# Patient Record
Sex: Male | Born: 1960 | Race: White | Hispanic: No | Marital: Married | Smoking: Current every day smoker
Health system: Southern US, Community
[De-identification: ages and names within clinical notes are randomized; demographics above are authoritative.]

## PROBLEM LIST (undated history)

## (undated) DIAGNOSIS — Z8719 Personal history of other diseases of the digestive system: Secondary | ICD-10-CM

## (undated) DIAGNOSIS — K219 Gastro-esophageal reflux disease without esophagitis: Secondary | ICD-10-CM

## (undated) DIAGNOSIS — J449 Chronic obstructive pulmonary disease, unspecified: Secondary | ICD-10-CM

---

## 2007-06-26 ENCOUNTER — Inpatient Hospital Stay (HOSPITAL_COMMUNITY): Admission: EM | Admit: 2007-06-26 | Discharge: 2007-06-28 | Payer: Self-pay | Admitting: Emergency Medicine

## 2007-07-12 ENCOUNTER — Encounter: Admission: RE | Admit: 2007-07-12 | Discharge: 2007-07-12 | Payer: Self-pay | Admitting: Orthopedic Surgery

## 2010-12-17 NOTE — Op Note (Signed)
NAME:  Manuel Anderson, Manuel Anderson               ACCOUNT NO.:  [id number]   MEDICAL RECORD NO.:  [id number]          PATIENT TYPE:  INP   LOCATION:  2550                         FACILITY:  [facility]   PHYSICIAN:  Burnard Bunting, M.D.    DATE OF BIRTH:  November 30, 1960   DATE OF PROCEDURE:  06/27/2007  DATE OF DISCHARGE:                               OPERATIVE REPORT   PREOPERATIVE DIAGNOSIS:  Open right comminuted olecranon and proximal  ulnar fracture.   POSTOPERATIVE DIAGNOSIS:  Open right comminuted olecranon and proximal  ulnar fracture.   PROCEDURES:  1. Right elbow debridement of skin and subcutaneous tissue, muscle      fascia and bone associated with open fracture.  2. Right elbow open reduction and internal fixation of comminuted      multifragmented proximal ulnar fracture.   SURGEON:  Burnard Bunting, M.D.   ASSISTANT:  Genene Churn. Denton Meek.   ANESTHESIA:  General. Endotracheal.   ESTIMATED BLOOD LOSS:  Minimal.   TOURNIQUET TIME:  One hour sixteen minutes at 250 mmHg.   INDICATIONS:  Manuel Anderson is a 50 year old patient with right open  comminuted proximal ulnar fracture who presents now for operative  management after explanation of the risks and benefits.   PROCEDURE IN DETAIL:  The patient is brought to the operating room where  general endotracheal anesthesia was induced.  Perioperative antibiotics  were administered.  Right elbow arm and hand was prepped with DuraPrep  solution and draped in a sterile manner.  A 1-cm laceration was noted  over the open fracture area.  Skin and subcutaneous tissue, muscle,  fascia and bone was then debrided after the incision was extended  proximally and distally.  Care was taken to avoid injury to the ulnar  nerve.  Periosteal dissection was performed in order to visualize the  subcutaneous portion of the ulna.  Multifragmented fracture was  identified.  Irrigation was performed.  The distal fragment and middle  fragment were reduced and held  with the reduction clamp and the lag  screw was placed under direct visualization from ulnar to radial.  In a  similar fashion, the proximal fragment and middle fragment were reduced  and the lag screw was placed.  An anatomic proximal ulnar plate was  fixed to the reduced fracture.  Under fluoroscopic guidance, the correct  length screws were placed.  Stable construct was achieved.  The  tourniquet was released.  Bleeding points encountered were closed with  electrocautery.  The arm was taken through a range of motion, no  grinding or crepitus was noted.  The patient has full flexion and  extension, pronation and supination.   At this time, under fluoroscopic guidance, correct hardware placement  was confirmed.  The incision was thoroughly irrigated and closed using  interrupted, inverted 2-0 Vicryl suture to achieve soft tissue closure  over the plate, followed by skin staples to reapproximate the skin  edges.  Bulky posterior splint was applied.  The patient tolerated the  procedure well without immediate complication.  He was transferred to  the recovery room in stable condition.  It should be noted that the assistant, Zonia Kief, was required at all  times during this case for the difficult exposure, fracture positioning,  implant placement and retraction of important neurovascular structures.  His assistance was a medical necessity for this case.      Burnard Bunting, M.D.  Electronically Signed     GSD/MEDQ  D:  06/27/2007  T:  06/27/2007  Job:  [id number]

## 2010-12-17 NOTE — H&P (Signed)
NAME:  Manuel, Anderson NO.:  [id number]   MEDICAL RECORD NO.:  [id number]          PATIENT TYPE:  EMS   LOCATION:  MAJO                         FACILITY:  [facility]   PHYSICIAN:  Burnard Bunting, M.D.    DATE OF BIRTH:  1961/05/28   DATE OF ADMISSION:  06/26/2007  DATE OF DISCHARGE:                              HISTORY & PHYSICAL   Silver Trauma Admission   CHIEF COMPLAINT:  Right elbow pain.   HISTORY OF PRESENT ILLNESS:  Manuel Anderson is a 50 year old, right-hand  dominant patient who had sustained an injury to his right elbow today  when he was cutting down a tree.  The tree limb fell in the wrong  direction which subsequently caused another tree to fall and break apart  and come up and hit him on the elbow.  He reports subsequent pain.  There was no loss of consciousness.  He was hypotensive when he arrived  in the ER, but responded nicely to fluids.  He denies any loss of  consciousness or numbness and tingling in his arm.   PAST MEDICAL HISTORY:  Negative.   PAST SURGICAL HISTORY:  Negative.   CURRENT MEDICATIONS:  Aleve and Vicodin.   HE IS ALLERGIC TO IBUPROFEN.   He is right-hand dominant, married.  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   PHYSICAL EXAMINATION:  GENERAL:  He is in mild distress.  VITAL SIGNS:  His blood pressure is 121/59, pulse 74, respirations 16.  He has a 99% room air sat.  CHEST:  Clear to auscultation.  HEART:  Regular rate and rhythm.  ABDOMINAL EXAM:  Benign.  EXTREMITIES:  He has a right arm puncture wound over the olecranon  region with bleeding; it is about a 1 cm laceration.  EPL/FPL inner  osseous is intact 5+/5, radial pulse 2+/4.  He denies any wrist or  shoulder pain.   Chest x-ray is within normal limits.   EKG is within normal limits.   Labs show creatinine of 1.1, hemoglobin of 16, sodium and potassium 133  and 3.2, BUN and creatinine 18 and 1.1.   CT of  abdomen is within normal limits.   CT of pelvis is within normal limits.   CT of chest is within normal limits.   Radiographs of the right arm show a three-part olecranon fracture with  the radial head located.   IMPRESSION:  Open right olecranon fracture.   PLAN:  Incision and drainage with plating.  Wrists and benefits  discussed with the patient including, but not limited to infection,  nerve vessel damage, nonunion, need to decrease smoking also discussed  with the patient; 2 gm of Ancef given in the ER, tetanus also given in  the ER.  All questions answered.      Burnard Bunting, M.D.  Electronically Signed     GSD/MEDQ  D:  06/26/2007  T:  06/27/2007  Job:  [id number]

## 2010-12-20 NOTE — Discharge Summary (Signed)
NAME:  Manuel, Anderson               ACCOUNT NO.:  [id number]   MEDICAL RECORD NO.:  [id number]          PATIENT TYPE:  INP   LOCATION:  5033                         FACILITY:  [facility]   PHYSICIAN:  Burnard Bunting, M.D.    DATE OF BIRTH:  10-10-1960   DATE OF ADMISSION:  06/26/2007  DATE OF DISCHARGE:  06/28/2007                               DISCHARGE SUMMARY   DISCHARGE DIAGNOSES:  Open right olecranon/proximal humerus fracture.   SECONDARY DIAGNOSIS:  __________   PROCEDURE:  Open reduction and internal fixation with debridement of  right open olecranon fracture.   HOSPITAL COURSE:  Manuel Anderson is a 50 year old male who sustained open  trauma to his right elbow, resulting in fracture.  He underwent  debridement and open reduction and internal fixation of his elbow  fracture on 06/26/2007.  He was maintained on perioperative IV  antibiotics during his hospital course.  Postoperatively motor and  sensory function of the hand was intact.  He had an unremarkable  recovery.  He was discharged home in good condition.   DISCHARGE MEDICATIONS:  Percocet for pain.  Robaxin as muscle relaxant.   FOLLOWUP:  He will be followed up in 7 days for suture removal.  Will  start range of motion at that time.      Burnard Bunting, M.D.  Electronically Signed     GSD/MEDQ  D:  08/12/2007  T:  08/12/2007  Job:  [id number]

## 2011-05-13 LAB — POCT I-STAT CREATININE
Creatinine, Ser: 1.1
Operator id: 294341

## 2011-05-13 LAB — I-STAT 8, (EC8 V) (CONVERTED LAB)
BUN: 8
Bicarbonate: 21.2
Chloride: 100
HCT: 47
Operator id: 294341
Potassium: 3.2 — ABNORMAL LOW
Sodium: 133 — ABNORMAL LOW
TCO2: 22
pCO2, Ven: 41.4 — ABNORMAL LOW
pH, Ven: 7.318 — ABNORMAL HIGH

## 2011-05-13 LAB — POCT CARDIAC MARKERS: Troponin i, poc: <0.05

## 2011-12-30 ENCOUNTER — Other Ambulatory Visit: Payer: Self-pay | Admitting: Family Medicine

## 2011-12-30 ENCOUNTER — Ambulatory Visit
Admission: RE | Admit: 2011-12-30 | Discharge: 2011-12-30 | Disposition: A | Discharge status: Home / Self Care | Payer: 59 | Source: Ambulatory Visit | Attending: Family Medicine | Admitting: Family Medicine

## 2011-12-30 DIAGNOSIS — R609 Edema, unspecified: Secondary | ICD-10-CM

## 2011-12-30 DIAGNOSIS — R52 Pain, unspecified: Secondary | ICD-10-CM

## 2015-04-27 ENCOUNTER — Inpatient Hospital Stay (HOSPITAL_COMMUNITY)
Admission: EM | Admit: 2015-04-27 | Discharge: 2015-05-01 | DRG: 419 | Disposition: A | Discharge status: Home / Self Care | Payer: Managed Care, Other (non HMO) | Attending: Surgery | Admitting: Surgery

## 2015-04-27 ENCOUNTER — Encounter (HOSPITAL_COMMUNITY): Payer: Self-pay | Admitting: Emergency Medicine

## 2015-04-27 ENCOUNTER — Emergency Department (HOSPITAL_COMMUNITY): Payer: Managed Care, Other (non HMO)

## 2015-04-27 DIAGNOSIS — K810 Acute cholecystitis: Secondary | ICD-10-CM | POA: Diagnosis present

## 2015-04-27 DIAGNOSIS — K222 Esophageal obstruction: Secondary | ICD-10-CM | POA: Diagnosis present

## 2015-04-27 DIAGNOSIS — G8929 Other chronic pain: Secondary | ICD-10-CM | POA: Diagnosis present

## 2015-04-27 DIAGNOSIS — F17210 Nicotine dependence, cigarettes, uncomplicated: Secondary | ICD-10-CM | POA: Diagnosis present

## 2015-04-27 DIAGNOSIS — K8067 Calculus of gallbladder and bile duct with acute and chronic cholecystitis with obstruction: Principal | ICD-10-CM | POA: Diagnosis present

## 2015-04-27 DIAGNOSIS — K8000 Calculus of gallbladder with acute cholecystitis without obstruction: Secondary | ICD-10-CM | POA: Diagnosis present

## 2015-04-27 DIAGNOSIS — Z79899 Other long term (current) drug therapy: Secondary | ICD-10-CM | POA: Diagnosis not present

## 2015-04-27 DIAGNOSIS — K8050 Calculus of bile duct without cholangitis or cholecystitis without obstruction: Secondary | ICD-10-CM

## 2015-04-27 DIAGNOSIS — Z720 Tobacco use: Secondary | ICD-10-CM | POA: Diagnosis present

## 2015-04-27 DIAGNOSIS — R1011 Right upper quadrant pain: Secondary | ICD-10-CM | POA: Diagnosis present

## 2015-04-27 DIAGNOSIS — K819 Cholecystitis, unspecified: Secondary | ICD-10-CM

## 2015-04-27 DIAGNOSIS — Z79891 Long term (current) use of opiate analgesic: Secondary | ICD-10-CM

## 2015-04-27 HISTORY — DX: Chronic obstructive pulmonary disease, unspecified: J44.9

## 2015-04-27 HISTORY — DX: Personal history of other diseases of the digestive system: Z87.19

## 2015-04-27 HISTORY — DX: Gastro-esophageal reflux disease without esophagitis: K21.9

## 2015-04-27 LAB — URINALYSIS, ROUTINE W REFLEX MICROSCOPIC
GLUCOSE, UA: NEGATIVE mg/dL
HGB URINE DIPSTICK: NEGATIVE
Ketones, ur: NEGATIVE mg/dL
Leukocytes, UA: NEGATIVE
Nitrite: NEGATIVE
PH: 8 (ref 5.0–8.0)
Protein, ur: NEGATIVE mg/dL
SPECIFIC GRAVITY, URINE: 1.014 (ref 1.005–1.030)
UROBILINOGEN UA: 1 mg/dL (ref 0.0–1.0)

## 2015-04-27 LAB — CBC
HEMATOCRIT: 43.7 % (ref 39.0–52.0)
Hemoglobin: 15.7 g/dL (ref 13.0–17.0)
MCH: 33.6 pg (ref 26.0–34.0)
MCHC: 35.9 g/dL (ref 30.0–36.0)
MCV: 93.6 fL (ref 78.0–100.0)
Platelets: 298 10*3/uL (ref 150–400)
RBC: 4.67 MIL/uL (ref 4.22–5.81)
RDW: 14.1 % (ref 11.5–15.5)
WBC: 8.2 10*3/uL (ref 4.0–10.5)

## 2015-04-27 LAB — COMPREHENSIVE METABOLIC PANEL
ALBUMIN: 4.4 g/dL (ref 3.5–5.0)
ALT: 287 U/L — ABNORMAL HIGH (ref 17–63)
AST: 218 U/L — AB (ref 15–41)
Alkaline Phosphatase: 148 U/L — ABNORMAL HIGH (ref 38–126)
Anion gap: 6 (ref 5–15)
BILIRUBIN TOTAL: 1.4 mg/dL — AB (ref 0.3–1.2)
CO2: 27 mmol/L (ref 22–32)
Calcium: 9.2 mg/dL (ref 8.9–10.3)
Chloride: 99 mmol/L — ABNORMAL LOW (ref 101–111)
Creatinine, Ser: 0.8 mg/dL (ref 0.61–1.24)
GFR calc Af Amer: >60 mL/min (ref >60)
GFR calc non Af Amer: >60 mL/min (ref >60)
GLUCOSE: 129 mg/dL — AB (ref 65–99)
POTASSIUM: 3.7 mmol/L (ref 3.5–5.1)
SODIUM: 132 mmol/L — AB (ref 135–145)
TOTAL PROTEIN: 7.4 g/dL (ref 6.5–8.1)

## 2015-04-27 LAB — LIPASE, BLOOD: Lipase: 28 U/L (ref 22–51)

## 2015-04-27 MED ORDER — METOCLOPRAMIDE HCL 5 MG/ML IJ SOLN: 5.0000 mg | Freq: Four times a day (QID) | INTRAMUSCULAR | Status: DC | PRN

## 2015-04-27 MED ORDER — FENTANYL CITRATE (PF) 100 MCG/2ML IJ SOLN
50.0000 ug | Freq: Once | INTRAMUSCULAR | Status: AC
  Administered 2015-04-27: 50 ug via INTRAVENOUS
  Filled 2015-04-27: qty 2

## 2015-04-27 MED ORDER — DIPHENHYDRAMINE HCL 50 MG/ML IJ SOLN: 12.5000 mg | Freq: Four times a day (QID) | INTRAMUSCULAR | Status: DC | PRN

## 2015-04-27 MED ORDER — MAGIC MOUTHWASH
15.0000 mL | Freq: Four times a day (QID) | ORAL | Status: DC | PRN
  Filled 2015-04-27: qty 15

## 2015-04-27 MED ORDER — OXYCODONE HCL 5 MG PO TABS: 5.0000 mg | ORAL_TABLET | ORAL | Status: DC | PRN

## 2015-04-27 MED ORDER — SODIUM CHLORIDE 0.9 % IV SOLN
8.0000 mg | Freq: Four times a day (QID) | INTRAVENOUS | Status: DC | PRN
  Filled 2015-04-27: qty 4

## 2015-04-27 MED ORDER — POLYETHYLENE GLYCOL 3350 17 G PO PACK
17.0000 g | PACK | Freq: Two times a day (BID) | ORAL | Status: DC
  Administered 2015-04-27 – 2015-04-30 (×5): 17 g via ORAL
  Filled 2015-04-27 (×11): qty 1

## 2015-04-27 MED ORDER — ONDANSETRON HCL 4 MG/2ML IJ SOLN: 4.0000 mg | Freq: Four times a day (QID) | INTRAMUSCULAR | Status: DC | PRN

## 2015-04-27 MED ORDER — LACTATED RINGERS IV BOLUS (SEPSIS): 1000.0000 mL | Freq: Three times a day (TID) | INTRAVENOUS | Status: DC | PRN

## 2015-04-27 MED ORDER — ALUM & MAG HYDROXIDE-SIMETH 200-200-20 MG/5ML PO SUSP: 30.0000 mL | Freq: Four times a day (QID) | ORAL | Status: DC | PRN

## 2015-04-27 MED ORDER — CHLORHEXIDINE GLUCONATE 4 % EX LIQD
1.0000 "application " | Freq: Once | CUTANEOUS | Status: AC
  Administered 2015-04-28: 1 via TOPICAL
  Filled 2015-04-27: qty 15

## 2015-04-27 MED ORDER — TAB-A-VITE/IRON PO TABS
1.0000 | ORAL_TABLET | Freq: Every day | ORAL | Status: DC
  Administered 2015-04-28 – 2015-04-29 (×2): 1 via ORAL
  Filled 2015-04-27 (×4): qty 1

## 2015-04-27 MED ORDER — DEXTROSE 5 % IV SOLN: 2.0000 g | INTRAVENOUS | Status: DC

## 2015-04-27 MED ORDER — DEXTROSE 5 % IV SOLN
1.0000 g | INTRAVENOUS | Status: AC
  Administered 2015-04-27: 1 g via INTRAVENOUS
  Filled 2015-04-27: qty 10

## 2015-04-27 MED ORDER — METOPROLOL TARTRATE 25 MG PO TABS
12.5000 mg | ORAL_TABLET | Freq: Two times a day (BID) | ORAL | Status: DC | PRN
  Filled 2015-04-27: qty 0.5

## 2015-04-27 MED ORDER — ENOXAPARIN SODIUM 40 MG/0.4ML ~~LOC~~ SOLN
40.0000 mg | Freq: Every day | SUBCUTANEOUS | Status: DC
  Administered 2015-04-27 – 2015-04-30 (×4): 40 mg via SUBCUTANEOUS
  Filled 2015-04-27 (×5): qty 0.4

## 2015-04-27 MED ORDER — ACETAMINOPHEN 500 MG PO TABS
1000.0000 mg | ORAL_TABLET | Freq: Three times a day (TID) | ORAL | Status: DC
  Administered 2015-04-27: 1000 mg via ORAL
  Filled 2015-04-27 (×7): qty 2

## 2015-04-27 MED ORDER — ONDANSETRON 4 MG PO TBDP: 4.0000 mg | ORAL_TABLET | Freq: Four times a day (QID) | ORAL | Status: DC | PRN

## 2015-04-27 MED ORDER — METOPROLOL TARTRATE 1 MG/ML IV SOLN
5.0000 mg | Freq: Four times a day (QID) | INTRAVENOUS | Status: DC | PRN
  Filled 2015-04-27: qty 5

## 2015-04-27 MED ORDER — TRIAMCINOLONE ACETONIDE 0.5 % EX CREA
1.0000 "application " | TOPICAL_CREAM | Freq: Four times a day (QID) | CUTANEOUS | Status: DC | PRN
  Filled 2015-04-27: qty 15

## 2015-04-27 MED ORDER — NICOTINE 14 MG/24HR TD PT24
14.0000 mg | MEDICATED_PATCH | Freq: Every day | TRANSDERMAL | Status: DC
  Administered 2015-04-27 – 2015-04-30 (×4): 14 mg via TRANSDERMAL
  Filled 2015-04-27 (×5): qty 1

## 2015-04-27 MED ORDER — IBUPROFEN 800 MG PO TABS: 800.0000 mg | ORAL_TABLET | Freq: Four times a day (QID) | ORAL | Status: DC | PRN

## 2015-04-27 MED ORDER — HYDROMORPHONE HCL 1 MG/ML IJ SOLN
0.5000 mg | INTRAMUSCULAR | Status: DC | PRN
  Administered 2015-04-28: 1 mg via INTRAVENOUS
  Filled 2015-04-27: qty 1

## 2015-04-27 MED ORDER — BISACODYL 10 MG RE SUPP
10.0000 mg | Freq: Two times a day (BID) | RECTAL | Status: DC | PRN
  Administered 2015-04-29: 10 mg via RECTAL
  Filled 2015-04-27: qty 1

## 2015-04-27 MED ORDER — LACTATED RINGERS IV BOLUS (SEPSIS)
1000.0000 mL | Freq: Once | INTRAVENOUS | Status: AC
  Administered 2015-04-27: 1000 mL via INTRAVENOUS

## 2015-04-27 MED ORDER — PROMETHAZINE HCL 25 MG/ML IJ SOLN
6.2500 mg | INTRAMUSCULAR | Status: DC | PRN
  Administered 2015-04-28: 6.25 mg via INTRAVENOUS

## 2015-04-27 MED ORDER — MENTHOL 3 MG MT LOZG
1.0000 | LOZENGE | OROMUCOSAL | Status: DC | PRN
  Filled 2015-04-27: qty 9

## 2015-04-27 MED ORDER — PANTOPRAZOLE SODIUM 40 MG PO TBEC
40.0000 mg | DELAYED_RELEASE_TABLET | Freq: Every day | ORAL | Status: DC
  Administered 2015-04-28 – 2015-04-30 (×3): 40 mg via ORAL
  Filled 2015-04-27 (×4): qty 1

## 2015-04-27 MED ORDER — SIMETHICONE 80 MG PO CHEW
80.0000 mg | CHEWABLE_TABLET | Freq: Four times a day (QID) | ORAL | Status: DC | PRN
  Administered 2015-04-28: 80 mg via ORAL
  Filled 2015-04-27 (×2): qty 1

## 2015-04-27 MED ORDER — PHENOL 1.4 % MT LIQD
2.0000 | OROMUCOSAL | Status: DC | PRN
Start: 2015-04-27 — End: 2015-05-01
  Filled 2015-04-27: qty 177

## 2015-04-27 MED ORDER — LACTATED RINGERS IV SOLN
INTRAVENOUS | Status: DC
  Administered 2015-04-28 – 2015-04-29 (×3): via INTRAVENOUS

## 2015-04-27 MED ORDER — ONDANSETRON HCL 4 MG/2ML IJ SOLN
4.0000 mg | Freq: Once | INTRAMUSCULAR | Status: AC
  Administered 2015-04-27: 4 mg via INTRAVENOUS
  Filled 2015-04-27: qty 2

## 2015-04-27 MED ORDER — IBUPROFEN 800 MG PO TABS: 800.0000 mg | ORAL_TABLET | Freq: Four times a day (QID) | ORAL | Status: DC

## 2015-04-27 MED ORDER — METRONIDAZOLE IN NACL 5-0.79 MG/ML-% IV SOLN
500.0000 mg | Freq: Four times a day (QID) | INTRAVENOUS | Status: DC
  Administered 2015-04-27 – 2015-04-28 (×3): 500 mg via INTRAVENOUS
  Filled 2015-04-27 (×4): qty 100

## 2015-04-27 MED ORDER — LIP MEDEX EX OINT
1.0000 "application " | TOPICAL_OINTMENT | Freq: Two times a day (BID) | CUTANEOUS | Status: DC
  Administered 2015-04-27 – 2015-04-30 (×7): 1 via TOPICAL
  Filled 2015-04-27: qty 7

## 2015-04-27 MED ORDER — CHLORHEXIDINE GLUCONATE 4 % EX LIQD
1.0000 "application " | Freq: Once | CUTANEOUS | Status: AC
  Administered 2015-04-27: 1 via TOPICAL
  Filled 2015-04-27: qty 15

## 2015-04-27 MED ORDER — ONDANSETRON HCL 4 MG PO TABS: 4.0000 mg | ORAL_TABLET | Freq: Four times a day (QID) | ORAL | Status: DC | PRN

## 2015-04-27 MED ORDER — POLYETHYLENE GLYCOL 3350 17 G PO PACK: 17.0000 g | PACK | Freq: Two times a day (BID) | ORAL | Status: DC | PRN

## 2015-04-27 MED ORDER — LORAZEPAM 2 MG/ML IJ SOLN: 0.5000 mg | Freq: Three times a day (TID) | INTRAMUSCULAR | Status: DC | PRN

## 2015-04-27 MED ORDER — DEXTROSE 5 % IV SOLN
1.0000 g | Freq: Once | INTRAVENOUS | Status: AC
  Administered 2015-04-27: 1 g via INTRAVENOUS
  Filled 2015-04-27: qty 10

## 2015-04-27 MED ORDER — VITAMIN C 500 MG PO TABS
500.0000 mg | ORAL_TABLET | Freq: Two times a day (BID) | ORAL | Status: DC
  Administered 2015-04-27 – 2015-04-30 (×6): 500 mg via ORAL
  Filled 2015-04-27 (×9): qty 1

## 2015-04-27 NOTE — H&P (Signed)
[facility]  [address]  [place], [place] [address] Phone: [phone] FAX: [phone]     Manuel Anderson  08/14/1960 [id number]  CARE TEAM:  PCP: Kaleen Mask, MD  Outpatient Care Team: Patient Care Team: Kaleen Mask, MD as PCP - General (Family Medicine)  Inpatient Treatment Team: Treatment Team: Attending Provider: Benjiman Core, MD; Technician: Berenice Primas, NT; Registered Nurse: Montey Hora, RN; Consulting Physician: Bishop Limbo, MD  This patient is a 54 y.o.male who presents today for surgical evaluation at the request of Benjiman Core, MD, [facility] emergency department.   Reason for evaluation: Thickened gallbladder.  Probable cholecystitis.  Pleasant smoking male.  Comes today with his wife.  Tends to avoid doctors.  Does moderate intense physical activity at work.  Had an episode of upper abdominal pain mainly right-sided about 6 months ago.  Thought it was heartburn or reflux.  Tums and Rolaids seem to help it.  Occasionally takes omeprazole for reflux like symptoms.  However yesterday after having a hotdog for lunch, he began to have intense abdominal pain in the right & central upper abdomen.  Went down but never went away.  Tried just a few bites of eggs & then crackers and sips but continued to worsen through the day.  He came to the emergency room.  Pain very focused in the upper abdomen.  More right-sided.  Feels bloated and nauseated.  Exam and ultrasound concerning for Murphy sign with thickened gallbladder.  Suspicious for cholecystitis.  Surgical consultation requested.  Normal rather active.  Does smoke about a pack and a half of cigarettes a day.  No history of heart attacks or strokes.  No severe dyspnea on exertion or chest pain.  No history of pneumonias.  Not on blood thinners.  No personal nor family history of GI/colon cancer, inflammatory bowel disease, irritable bowel  syndrome, allergy such as Celiac Sprue, dietary/dairy problems, colitis, ulcers nor gastritis.  No recent sick contacts/gastroenteritis.  No travel outside the country.  No changes in diet.  No dysphagia to solids or liquids.  He has never had a colonoscopy.  No hematochezia, hematemesis, coffee ground emesis.  No evidence of prior gastric/peptic ulceration.  History reviewed. No pertinent past medical history.  History reviewed. No pertinent past surgical history.  Social History   Social History  . Marital Status: Married    Spouse Name: N/A  . Number of Children: N/A  . Years of Education: N/A   Occupational History  . Not on file.   Social History Main Topics  . Smoking status: Current Every Day Smoker -- 1.25 packs/day    Types: Cigarettes  . Smokeless tobacco: Not on file  . Alcohol Use: No  . Drug Use: No  . Sexual Activity: Not on file   Other Topics Concern  . Not on file   Social History Narrative  . No narrative on file    History reviewed. No pertinent family history.  No current facility-administered medications for this encounter.   Current Outpatient Prescriptions  Medication Sig Dispense Refill  . HYDROcodone-acetaminophen (NORCO) 7.5-325 MG per tablet Take 1 tablet by mouth every 6 (six) hours.    Marland Kitchen omeprazole (PRILOSEC) 20 MG capsule Take 20 mg by mouth daily.    . Simethicone (GAS-X PO) Take 1 tablet by mouth 2 (two) times daily as needed (gas).    . triamcinolone cream (KENALOG) 0.5 % Apply 1 application topically every  6 (six) hours as needed (rash).       No Known Allergies  ROS: Constitutional:  No fevers, chills, sweats.  Weight stable Eyes:  No vision changes, No discharge HENT:  No sore throats, nasal drainage Lymph: No neck swelling, No bruising easily Pulmonary:  No cough, productive sputum CV: No orthopnea, PND  Patient walks 60 minutes for about 2 miles without difficulty.  No exertional chest/neck/shoulder/arm pain. GI:  No personal  nor family history of GI/colon cancer, inflammatory bowel disease, irritable bowel syndrome, allergy such as Celiac Sprue, dietary/dairy problems, colitis, ulcers nor gastritis.  No recent sick contacts/gastroenteritis.  No travel outside the country.  No changes in diet. Renal: No UTIs, No hematuria Genital:  No drainage, bleeding, masses Musculoskeletal: No severe joint pain.  Good ROM major joints Skin:  No sores or lesions.  No rashes Heme/Lymph:  No easy bleeding.  No swollen lymph nodes Neuro: No focal weakness/numbness.  No seizures Psych: No suicidal ideation.  No hallucinations  BP 142/82 mmHg  Pulse 63  Temp(Src) 97.4 F (36.3 C) (Oral)  Resp 18  SpO2 95%  Physical Exam: General: Pt awake/alert/oriented x4 in no major acute distress Eyes: PERRL, normal EOM. Sclera nonicteric Neuro: CN II-XII intact w/o focal sensory/motor deficits. Lymph: No head/neck/groin lymphadenopathy Psych:  No delerium/psychosis/paranoia HENT: Normocephalic, Mucus membranes moist.  No thrush Neck: Supple, No tracheal deviation Chest: No pain.  Good respiratory excursion. CV:  Pulses intact.  Regular rhythm Abdomen: Soft, Nondistended.  Rather tender in right upper quadrant with Murphy sign.  Rest of the abdomen is nontender.  No diastases recti.  No umbilical hernia.  No incarcerated hernias. Genital: Normal external male genitalia.  No inguinal hernias. Ext:  SCDs BLE.  No significant edema.  No cyanosis Skin: No petechiae / purpurea.  No major sores Musculoskeletal: No severe joint pain.  Good ROM major joints   Results:   Labs: Results for orders placed or performed during the hospital encounter of 04/27/15 (from the past 48 hour(s))  Urinalysis, Routine w reflex microscopic (not at [facility])     Status: Abnormal   Collection Time: 04/27/15  2:20 PM  Result Value Ref Range   Color, Urine AMBER (A) YELLOW    Comment: BIOCHEMICALS MAY BE AFFECTED BY COLOR   APPearance CLEAR CLEAR   Specific  Gravity, Urine 1.014 1.005 - 1.030   pH 8.0 5.0 - 8.0   Glucose, UA NEGATIVE NEGATIVE mg/dL   Hgb urine dipstick NEGATIVE NEGATIVE   Bilirubin Urine SMALL (A) NEGATIVE   Ketones, ur NEGATIVE NEGATIVE mg/dL   Protein, ur NEGATIVE NEGATIVE mg/dL   Urobilinogen, UA 1.0 0.0 - 1.0 mg/dL   Nitrite NEGATIVE NEGATIVE   Leukocytes, UA NEGATIVE NEGATIVE    Comment: MICROSCOPIC NOT DONE ON URINES WITH NEGATIVE PROTEIN, BLOOD, LEUKOCYTES, NITRITE, OR GLUCOSE <1000 mg/dL.  Lipase, blood     Status: None   Collection Time: 04/27/15  2:41 PM  Result Value Ref Range   Lipase 28 22 - 51 U/L  Comprehensive metabolic panel     Status: Abnormal   Collection Time: 04/27/15  2:41 PM  Result Value Ref Range   Sodium 132 (L) 135 - 145 mmol/L   Potassium 3.7 3.5 - 5.1 mmol/L   Chloride 99 (L) 101 - 111 mmol/L   CO2 27 22 - 32 mmol/L   Glucose, Bld 129 (H) 65 - 99 mg/dL   BUN <5 (L) 6 - 20 mg/dL   Creatinine, Ser 0.80 0.61 -  1.24 mg/dL   Calcium 9.2 8.9 - 10.3 mg/dL   Total Protein 7.4 6.5 - 8.1 g/dL   Albumin 4.4 3.5 - 5.0 g/dL   AST 218 (H) 15 - 41 U/L   ALT 287 (H) 17 - 63 U/L   Alkaline Phosphatase 148 (H) 38 - 126 U/L   Total Bilirubin 1.4 (H) 0.3 - 1.2 mg/dL   GFR calc non Af Amer >60 >60 mL/min   GFR calc Af Amer >60 >60 mL/min    Comment: (NOTE) The eGFR has been calculated using the CKD EPI equation. This calculation has not been validated in all clinical situations. eGFR's persistently <60 mL/min signify possible Chronic Kidney Disease.    Anion gap 6 5 - 15  CBC     Status: None   Collection Time: 04/27/15  2:41 PM  Result Value Ref Range   WBC 8.2 4.0 - 10.5 K/uL   RBC 4.67 4.22 - 5.81 MIL/uL   Hemoglobin 15.7 13.0 - 17.0 g/dL   HCT 43.7 39.0 - 52.0 %   MCV 93.6 78.0 - 100.0 fL   MCH 33.6 26.0 - 34.0 pg   MCHC 35.9 30.0 - 36.0 g/dL   RDW 14.1 11.5 - 15.5 %   Platelets 298 150 - 400 K/uL    Imaging / Studies: Dg Chest 2 View  04/27/2015   CLINICAL DATA:  Abdominal pain.   Smoker.  EXAM: CHEST  2 VIEW  COMPARISON:  06/26/2007.  FINDINGS: Normal sized heart. Clear lungs. The lungs are mildly hyperexpanded with mild diffuse peribronchial thickening. Mild thoracic spine degenerative changes and mild scoliosis.  IMPRESSION: No acute abnormality.  Mild changes of COPD and chronic bronchitis.   Electronically Signed   By: Claudie Revering M.D.   On: 04/27/2015 18:56   US Abdomen Complete  04/27/2015   CLINICAL DATA:  Right upper quadrant for 2 days. Increased liver function tests.  EXAM: ULTRASOUND ABDOMEN COMPLETE  COMPARISON:  CT of the abdomen dated 06/26/2007  FINDINGS: Gallbladder: The gallbladder bladder is partially collapsed and therefore suboptimally evaluated, however there is thickening of the gallbladder wall to 9 mm, which is found to be abnormal even for its decompressed state. There are areas of echogenicity, with associated dense posterior acoustic shadowing likely representing gallstones, the largest of which measures 8 mm.  Common bile duct: Diameter: 8 mm, enlarged.  Liver: No focal lesion identified. Within normal limits in parenchymal echogenicity.  IVC: No abnormality visualized.  Pancreas: Visualized portion unremarkable.  Spleen: Size and appearance within normal limits.  Right Kidney: Length: 11.1 cm. Echogenicity within normal limits. No mass or hydronephrosis visualized.  Left Kidney: Length: 11.5 cm. Echogenicity within normal limits. No mass or hydronephrosis visualized.  Abdominal aorta: No aneurysm visualized. Abdominal aortic ectasia is noted. Maximum diameter of 2.1 cm is recorded.  Other findings: None.  IMPRESSION: Cholelithiasis with grossly abnormal appearance of the gallbladder wall, concerning for acute cholecystitis.  Borderline enlargement of the common bile duct.   Electronically Signed   By: Fidela Salisbury M.D.   On: 04/27/2015 17:08    Medications / Allergies: per chart  Antibiotics: Anti-infectives    Start     Dose/Rate Route  Frequency Ordered Stop   04/27/15 1830  cefTRIAXone (ROCEPHIN) 1 g in dextrose 5 % 50 mL IVPB     1 g 100 mL/hr over 30 Minutes Intravenous  Once 04/27/15 1825 04/27/15 1913      Assessment  Manuel Anderson  54 y.o. male  Problem List:  Active Problems:   Tobacco abuse   History physical and ultrasound findings concerning for acute cholecystitis with persistent pain over 24 hours.  Plan:  Admit.  IV antibiotics.  IV fluids.  Pain and nausea control.  Laparoscopic cholecystectomy.  With the significant gallbladder wall thickening, may not be able to resect & switch to drain and come back to operate 6 weeks later after attack resolved.  Seems less likely that he has a gallbladder cancer without any metastatic disease given thickening.  Story more suspicious for biliary colic/cholecystitis.  The anatomy & physiology of hepatobiliary & pancreatic function was discussed.  The pathophysiology of gallbladder dysfunction was discussed.  Natural history risks without surgery was discussed.   I feel the risks of no intervention will lead to serious problems that outweigh the operative risks; therefore, I recommended cholecystectomy to remove the pathology.  I explained laparoscopic techniques with possible need for an open approach.  Probable cholangiogram to evaluate the bilary tract was explained as well.    Risks such as bleeding, infection, abscess, leak, injury to other organs, need for repair of tissues / organs, need for further treatment, stroke, heart attack, death, and other risks were discussed.  I noted a good likelihood this will help address the problem.  Possibility that this will not correct all abdominal symptoms was explained.  Goals of post-operative recovery were discussed as well.  We will work to minimize complications.  An educational handout further explaining the pathology and treatment options was given as well.  Questions were answered.  The patient expresses  understanding & wishes to proceed with surgery.  -STOP SMOKING! We talked to the patient about the dangers of smoking.  We stressed that tobacco use dramatically increases the risk of peri-operative complications such as infection, tissue necrosis leaving to problems with incision/wound and organ healing, hernia, chronic pain, heart attack, stroke, DVT, pulmonary embolism, and death.  We noted there are programs in our community to help stop smoking.  Information was available.  -VTE prophylaxis- SCDs, etc -mobilize as tolerated to help recovery    Adin Hector, M.D., F.A.C.S. Gastrointestinal and Minimally Invasive Surgery [facility] [address] [place], [place] [address] [phone] Main / Paging   04/27/2015  Note: Portions of this report may have been transcribed using voice recognition software. Every effort was made to ensure accuracy; however, inadvertent computerized transcription errors may be present.   Any transcriptional errors that result from this process are unintentional.

## 2015-04-27 NOTE — ED Provider Notes (Signed)
CSN: [id number]     Arrival date & time 04/27/15  1346 History   First MD Initiated Contact with Patient 04/27/15 1542     Chief Complaint  Patient presents with  . Abdominal Pain     (Consider location/radiation/quality/duration/timing/severity/associated sxs/prior Treatment) Patient is a 54 y.o. male presenting with abdominal pain. The history is provided by the patient.  Abdominal Pain Associated symptoms: nausea   Associated symptoms: no chest pain, no constipation, no diarrhea and no shortness of breath    patient has had abdominal pain for last month or 2. Worse after eating. States he'll sometimes just come on. States abdomen is larger and he will will have to belch. States it hurts more after eating. Has been taking omeprazole and Gas-X with some relief but got worse today. States was more severe last night. No diarrhea or constipation. No fevers.  History reviewed. No pertinent past medical history. History reviewed. No pertinent past surgical history. History reviewed. No pertinent family history. Social History  Substance Use Topics  . Smoking status: Current Every Day Smoker -- 1.25 packs/day    Types: Cigarettes  . Smokeless tobacco: None  . Alcohol Use: No    Review of Systems  Constitutional: Positive for appetite change. Negative for activity change and unexpected weight change.  Eyes: Negative for pain.  Respiratory: Negative for chest tightness and shortness of breath.   Cardiovascular: Negative for chest pain and leg swelling.  Gastrointestinal: Positive for nausea, abdominal pain and abdominal distention. Negative for diarrhea and constipation.  Genitourinary: Negative for flank pain.  Musculoskeletal: Negative for back pain and neck stiffness.  Skin: Negative for rash.  Neurological: Negative for weakness, numbness and headaches.  Psychiatric/Behavioral: Negative for behavioral problems.      Allergies  Review of patient's allergies indicates no known  allergies.  Home Medications   Prior to Admission medications   Medication Sig Start Date End Date Taking? Authorizing Provider  omeprazole (PRILOSEC) 20 MG capsule Take 20 mg by mouth daily.   Yes Historical Provider, MD  Simethicone (GAS-X PO) Take 1 tablet by mouth 2 (two) times daily as needed (gas).   Yes Historical Provider, MD  triamcinolone cream (KENALOG) 0.5 % Apply 1 application topically every 6 (six) hours as needed (rash).   Yes Historical Provider, MD  HYDROcodone-acetaminophen (NORCO) 7.5-325 MG per tablet Take 1-2 tablets by mouth every 4 (four) hours as needed for moderate pain. 04/28/15   Karie Soda, MD   BP 121/69 mmHg  Pulse 66  Temp(Src) 98.2 F (36.8 C) (Oral)  Resp 16  Ht  (1.753 m)  Wt 165 lb 1.6 oz (74.889 kg)  BMI 24.37 kg/m2  SpO2 95% Physical Exam  Constitutional: He appears well-developed.  HENT:  Head: Normocephalic.  Eyes: Pupils are equal, round, and reactive to light.  Neck: Neck supple.  Cardiovascular: Normal rate and regular rhythm.   Pulmonary/Chest: Effort normal.  Abdominal: There is tenderness.  Right upper quadrant tenderness to epigastric tenderness without rebound or guarding.  Musculoskeletal: Normal range of motion.  Neurological: He is alert.  Skin: Skin is warm.    ED Course  Procedures (including critical care time) Labs Review Labs Reviewed  COMPREHENSIVE METABOLIC PANEL - Abnormal; Notable for the following:    Sodium 132 (*)    Chloride 99 (*)    Glucose, Bld 129 (*)    BUN <5 (*)    AST 218 (*)    ALT 287 (*)    Alkaline Phosphatase 148 (*)  Total Bilirubin 1.4 (*)    All other components within normal limits  URINALYSIS, ROUTINE W REFLEX MICROSCOPIC (NOT AT [facility]) - Abnormal; Notable for the following:    Color, Urine [place] (*)    Bilirubin Urine SMALL (*)    All other components within normal limits  HEPATIC FUNCTION PANEL - Abnormal; Notable for the following:    AST 204 (*)    ALT 295 (*)     Alkaline Phosphatase 178 (*)    Total Bilirubin 2.6 (*)    Bilirubin, Direct 2.0 (*)    All other components within normal limits  SURGICAL PCR SCREEN  LIPASE, BLOOD  CBC  CBC  SURGICAL PATHOLOGY    Imaging Review Dg Chest 2 View  04/27/2015   CLINICAL DATA:  Abdominal pain.  Smoker.  EXAM: CHEST  2 VIEW  COMPARISON:  06/26/2007.  FINDINGS: Normal sized heart. Clear lungs. The lungs are mildly hyperexpanded with mild diffuse peribronchial thickening. Mild thoracic spine degenerative changes and mild scoliosis.  IMPRESSION: No acute abnormality.  Mild changes of COPD and chronic bronchitis.   Electronically Signed   By: Beckie Salts M.D.   On: 04/27/2015 18:56   Dg Cholangiogram Operative  04/28/2015   CLINICAL DATA:  54 year old male with a history of acute cholecystitis and intraoperative cholangiogram  EXAM: INTRAOPERATIVE CHOLANGIOGRAM  TECHNIQUE: Cholangiographic images from the C-arm fluoroscopic device were submitted for interpretation post-operatively. Please see the procedural report for the amount of contrast and the fluoroscopy time utilized.  COMPARISON:  Ultrasound 04/27/2015  FINDINGS: Surgical instruments project over the upper abdomen.  There is cannulation of the cystic duct/gallbladder neck, with antegrade infusion of contrast. Caliber of the extrahepatic ductal system within normal limits.  Vague filling defects at the distal common bile duct may represent gas, debris, or stones.  Contrast does not cross the ampulla into the duodenum.  IMPRESSION: Intraoperative cholangiogram demonstrates extrahepatic biliary ducts of unremarkable caliber, with vague filling defects at the distal common bile duct either representing debris, air, or stones. Contrast does not cross the ampulla into the duodenum on the final images.  Please refer to the dictated operative report for full details of intraoperative findings and procedure.  Signed,  Yvone Neu. Loreta Ave, DO  [facility]  [facility]   Electronically Signed   By: Gilmer Mor D.O.   On: 04/28/2015 08:43   US Abdomen Complete  04/27/2015   CLINICAL DATA:  Right upper quadrant for 2 days. Increased liver function tests.  EXAM: ULTRASOUND ABDOMEN COMPLETE  COMPARISON:  CT of the abdomen dated 06/26/2007  FINDINGS: Gallbladder: The gallbladder bladder is partially collapsed and therefore suboptimally evaluated, however there is thickening of the gallbladder wall to 9 mm, which is found to be abnormal even for its decompressed state. There are areas of echogenicity, with associated dense posterior acoustic shadowing likely representing gallstones, the largest of which measures 8 mm.  Common bile duct: Diameter: 8 mm, enlarged.  Liver: No focal lesion identified. Within normal limits in parenchymal echogenicity.  IVC: No abnormality visualized.  Pancreas: Visualized portion unremarkable.  Spleen: Size and appearance within normal limits.  Right Kidney: Length: 11.1 cm. Echogenicity within normal limits. No mass or hydronephrosis visualized.  Left Kidney: Length: 11.5 cm. Echogenicity within normal limits. No mass or hydronephrosis visualized.  Abdominal aorta: No aneurysm visualized. Abdominal aortic ectasia is noted. Maximum diameter of 2.1 cm is recorded.  Other findings: None.  IMPRESSION: Cholelithiasis with grossly abnormal appearance of  the gallbladder wall, concerning for acute cholecystitis.  Borderline enlargement of the common bile duct.   Electronically Signed   By: Ted Mcalpine M.D.   On: 04/27/2015 17:08   I have personally reviewed and evaluated these images and lab results as part of my medical decision-making.   EKG Interpretation   Date/Time:  Friday April 27 2015 18:41:37 EDT Ventricular Rate:  65 PR Interval:  162 QRS Duration: 96 QT Interval:  407 QTC Calculation: 423 R Axis:   89 Text Interpretation:  Sinus rhythm Nonspecific T abnormalities, lateral  leads  ED PHYSICIAN INTERPRETATION AVAILABLE IN [facility] Confirmed  by TEST, Record ([id number]) on 04/28/2015 10:42:22 AM      MDM   Final diagnoses:  Acute cholecystitis    Patient with abdominal pain. LFTs were elevated. CT scan shows possible cholecystitis. Admit to general surgery.    Benjiman Core, MD 04/28/15 [phone]

## 2015-04-27 NOTE — ED Notes (Signed)
Patient transported to X-ray 

## 2015-04-27 NOTE — Progress Notes (Signed)
ED CM spoke with pt and updated pcp as Manuel Anderson

## 2015-04-27 NOTE — ED Notes (Signed)
Patient reports diffuse abdominal pain concentrated in RLQ with gas. Denies n/v/diarrhea.

## 2015-04-28 ENCOUNTER — Inpatient Hospital Stay (HOSPITAL_COMMUNITY): Payer: Managed Care, Other (non HMO)

## 2015-04-28 ENCOUNTER — Encounter (HOSPITAL_COMMUNITY): Admission: EM | Disposition: A | Discharge status: Home / Self Care | Payer: Self-pay | Source: Home / Self Care

## 2015-04-28 ENCOUNTER — Inpatient Hospital Stay: Admit: 2015-04-28 | Payer: Self-pay | Admitting: Surgery

## 2015-04-28 ENCOUNTER — Inpatient Hospital Stay (HOSPITAL_COMMUNITY): Payer: Managed Care, Other (non HMO) | Admitting: Anesthesiology

## 2015-04-28 ENCOUNTER — Encounter (HOSPITAL_COMMUNITY): Payer: Self-pay | Admitting: Anesthesiology

## 2015-04-28 HISTORY — PX: CHOLECYSTECTOMY: SHX55

## 2015-04-28 LAB — SURGICAL PCR SCREEN
MRSA, PCR: NEGATIVE
Staphylococcus aureus: NEGATIVE

## 2015-04-28 LAB — HEPATIC FUNCTION PANEL
ALT: 295 U/L — AB (ref 17–63)
AST: 204 U/L — AB (ref 15–41)
Albumin: 3.7 g/dL (ref 3.5–5.0)
Alkaline Phosphatase: 178 U/L — ABNORMAL HIGH (ref 38–126)
BILIRUBIN DIRECT: 2 mg/dL — AB (ref 0.1–0.5)
BILIRUBIN INDIRECT: 0.6 mg/dL (ref 0.3–0.9)
BILIRUBIN TOTAL: 2.6 mg/dL — AB (ref 0.3–1.2)
Total Protein: 6.7 g/dL (ref 6.5–8.1)

## 2015-04-28 LAB — CBC
HCT: 41.2 % (ref 39.0–52.0)
HEMOGLOBIN: 14.5 g/dL (ref 13.0–17.0)
MCH: 33.1 pg (ref 26.0–34.0)
MCHC: 35.2 g/dL (ref 30.0–36.0)
MCV: 94.1 fL (ref 78.0–100.0)
PLATELETS: 277 10*3/uL (ref 150–400)
RBC: 4.38 MIL/uL (ref 4.22–5.81)
RDW: 14.2 % (ref 11.5–15.5)
WBC: 7.8 10*3/uL (ref 4.0–10.5)

## 2015-04-28 SURGERY — LAPAROSCOPIC CHOLECYSTECTOMY WITH INTRAOPERATIVE CHOLANGIOGRAM
Anesthesia: General | Site: Abdomen

## 2015-04-28 MED ORDER — ROCURONIUM BROMIDE 100 MG/10ML IV SOLN
INTRAVENOUS | Status: DC | PRN
  Administered 2015-04-28 (×2): 5 mg via INTRAVENOUS
  Administered 2015-04-28: 35 mg via INTRAVENOUS

## 2015-04-28 MED ORDER — LABETALOL HCL 5 MG/ML IV SOLN
INTRAVENOUS | Status: AC
  Filled 2015-04-28: qty 4

## 2015-04-28 MED ORDER — FENTANYL CITRATE (PF) 250 MCG/5ML IJ SOLN
INTRAMUSCULAR | Status: AC
  Filled 2015-04-28: qty 25

## 2015-04-28 MED ORDER — HYDROMORPHONE HCL 1 MG/ML IJ SOLN
INTRAMUSCULAR | Status: AC
  Filled 2015-04-28: qty 1

## 2015-04-28 MED ORDER — PROPOFOL 10 MG/ML IV BOLUS
INTRAVENOUS | Status: AC
  Filled 2015-04-28: qty 20

## 2015-04-28 MED ORDER — SODIUM CHLORIDE 0.9 % IJ SOLN
3.0000 mL | Freq: Two times a day (BID) | INTRAMUSCULAR | Status: DC
  Administered 2015-04-29: 3 mL via INTRAVENOUS

## 2015-04-28 MED ORDER — SODIUM CHLORIDE 0.9 % IJ SOLN: 3.0000 mL | INTRAMUSCULAR | Status: DC | PRN

## 2015-04-28 MED ORDER — DEXTROSE 5 % IV SOLN
INTRAVENOUS | Status: AC
  Filled 2015-04-28: qty 2

## 2015-04-28 MED ORDER — LABETALOL HCL 5 MG/ML IV SOLN
INTRAVENOUS | Status: DC | PRN
  Administered 2015-04-28 (×2): 5 mg via INTRAVENOUS

## 2015-04-28 MED ORDER — LIDOCAINE HCL (CARDIAC) 20 MG/ML IV SOLN
INTRAVENOUS | Status: DC | PRN
  Administered 2015-04-28: 100 mg via INTRAVENOUS

## 2015-04-28 MED ORDER — ONDANSETRON HCL 4 MG/2ML IJ SOLN
INTRAMUSCULAR | Status: DC | PRN
  Administered 2015-04-28: 4 mg via INTRAVENOUS

## 2015-04-28 MED ORDER — DEXAMETHASONE SODIUM PHOSPHATE 10 MG/ML IJ SOLN
INTRAMUSCULAR | Status: AC
  Filled 2015-04-28: qty 2

## 2015-04-28 MED ORDER — ONDANSETRON HCL 4 MG/2ML IJ SOLN
INTRAMUSCULAR | Status: AC
  Filled 2015-04-28: qty 2

## 2015-04-28 MED ORDER — SUCCINYLCHOLINE CHLORIDE 20 MG/ML IJ SOLN
INTRAMUSCULAR | Status: DC | PRN
  Administered 2015-04-28: 100 mg via INTRAVENOUS

## 2015-04-28 MED ORDER — SODIUM CHLORIDE 0.9 % IJ SOLN
INTRAMUSCULAR | Status: AC
  Filled 2015-04-28: qty 10

## 2015-04-28 MED ORDER — PHENYLEPHRINE HCL 10 MG/ML IJ SOLN
INTRAMUSCULAR | Status: DC | PRN
  Administered 2015-04-28 (×2): 80 ug via INTRAVENOUS

## 2015-04-28 MED ORDER — DEXAMETHASONE SODIUM PHOSPHATE 10 MG/ML IJ SOLN
INTRAMUSCULAR | Status: DC | PRN
  Administered 2015-04-28: 10 mg via INTRAVENOUS

## 2015-04-28 MED ORDER — METRONIDAZOLE IN NACL 5-0.79 MG/ML-% IV SOLN
500.0000 mg | Freq: Four times a day (QID) | INTRAVENOUS | Status: AC
  Administered 2015-04-28 – 2015-04-29 (×5): 500 mg via INTRAVENOUS
  Filled 2015-04-28 (×5): qty 100

## 2015-04-28 MED ORDER — GLUCAGON HCL RDNA (DIAGNOSTIC) 1 MG IJ SOLR
INTRAMUSCULAR | Status: DC | PRN
  Administered 2015-04-28: 1 mg via INTRAVENOUS

## 2015-04-28 MED ORDER — DEXTROSE 5 % IV SOLN
2.0000 g | INTRAVENOUS | Status: AC
  Administered 2015-04-28 – 2015-04-29 (×2): 2 g via INTRAVENOUS
  Filled 2015-04-28 (×2): qty 2

## 2015-04-28 MED ORDER — LACTATED RINGERS IR SOLN
Status: DC | PRN
  Administered 2015-04-28: 1

## 2015-04-28 MED ORDER — IOHEXOL 300 MG/ML  SOLN
INTRAMUSCULAR | Status: DC | PRN
  Administered 2015-04-28: 15 mL

## 2015-04-28 MED ORDER — GLUCAGON HCL RDNA (DIAGNOSTIC) 1 MG IJ SOLR
INTRAMUSCULAR | Status: AC
  Filled 2015-04-28: qty 1

## 2015-04-28 MED ORDER — STERILE WATER FOR IRRIGATION IR SOLN
Status: DC | PRN
  Administered 2015-04-28: 1000 mL

## 2015-04-28 MED ORDER — PROMETHAZINE HCL 25 MG/ML IJ SOLN: 6.2500 mg | Freq: Four times a day (QID) | INTRAMUSCULAR | Status: DC | PRN

## 2015-04-28 MED ORDER — FENTANYL CITRATE (PF) 100 MCG/2ML IJ SOLN
INTRAMUSCULAR | Status: DC | PRN
  Administered 2015-04-28 (×2): 50 ug via INTRAVENOUS
  Administered 2015-04-28: 150 ug via INTRAVENOUS

## 2015-04-28 MED ORDER — SUGAMMADEX SODIUM 200 MG/2ML IV SOLN
INTRAVENOUS | Status: DC | PRN
  Administered 2015-04-28: 150 mg via INTRAVENOUS

## 2015-04-28 MED ORDER — SUGAMMADEX SODIUM 200 MG/2ML IV SOLN
INTRAVENOUS | Status: AC
  Filled 2015-04-28: qty 2

## 2015-04-28 MED ORDER — SODIUM CHLORIDE 0.9 % IV SOLN: 250.0000 mL | INTRAVENOUS | Status: DC | PRN

## 2015-04-28 MED ORDER — HYDROMORPHONE HCL 1 MG/ML IJ SOLN
0.2500 mg | INTRAMUSCULAR | Status: DC | PRN
  Administered 2015-04-28 (×4): 0.5 mg via INTRAVENOUS

## 2015-04-28 MED ORDER — KETOROLAC TROMETHAMINE 30 MG/ML IJ SOLN
INTRAMUSCULAR | Status: AC
  Filled 2015-04-28: qty 1

## 2015-04-28 MED ORDER — LIDOCAINE HCL (CARDIAC) 20 MG/ML IV SOLN
INTRAVENOUS | Status: AC
  Filled 2015-04-28: qty 5

## 2015-04-28 MED ORDER — PROMETHAZINE HCL 25 MG/ML IJ SOLN
INTRAMUSCULAR | Status: AC
  Filled 2015-04-28: qty 1

## 2015-04-28 MED ORDER — LACTATED RINGERS IV BOLUS (SEPSIS): 1000.0000 mL | Freq: Three times a day (TID) | INTRAVENOUS | Status: AC | PRN

## 2015-04-28 MED ORDER — FENTANYL CITRATE (PF) 100 MCG/2ML IJ SOLN
INTRAMUSCULAR | Status: AC
  Filled 2015-04-28: qty 4

## 2015-04-28 MED ORDER — KETOROLAC TROMETHAMINE 30 MG/ML IJ SOLN
INTRAMUSCULAR | Status: DC | PRN
  Administered 2015-04-28: 30 mg via INTRAVENOUS

## 2015-04-28 MED ORDER — BUPIVACAINE-EPINEPHRINE 0.25% -1:200000 IJ SOLN
INTRAMUSCULAR | Status: DC | PRN
  Administered 2015-04-28: 50 mL

## 2015-04-28 MED ORDER — METHOCARBAMOL 1000 MG/10ML IJ SOLN
1000.0000 mg | Freq: Four times a day (QID) | INTRAVENOUS | Status: DC | PRN
  Filled 2015-04-28: qty 10

## 2015-04-28 MED ORDER — HYDROCODONE-ACETAMINOPHEN 7.5-325 MG PO TABS: 1.0000 | ORAL_TABLET | ORAL | Status: AC | PRN

## 2015-04-28 MED ORDER — BUPIVACAINE-EPINEPHRINE 0.25% -1:200000 IJ SOLN
INTRAMUSCULAR | Status: AC
  Filled 2015-04-28: qty 1

## 2015-04-28 MED ORDER — METRONIDAZOLE IN NACL 5-0.79 MG/ML-% IV SOLN
INTRAVENOUS | Status: AC
  Filled 2015-04-28: qty 100

## 2015-04-28 MED ORDER — HYDROCODONE-ACETAMINOPHEN 7.5-325 MG PO TABS
1.0000 | ORAL_TABLET | ORAL | Status: DC | PRN
  Administered 2015-04-28 – 2015-05-01 (×15): 2 via ORAL
  Filled 2015-04-28 (×15): qty 2

## 2015-04-28 MED ORDER — 0.9 % SODIUM CHLORIDE (POUR BTL) OPTIME
TOPICAL | Status: DC | PRN
  Administered 2015-04-28: 1000 mL

## 2015-04-28 MED ORDER — PROPOFOL 10 MG/ML IV BOLUS
INTRAVENOUS | Status: DC | PRN
  Administered 2015-04-28: 170 mg via INTRAVENOUS

## 2015-04-28 SURGICAL SUPPLY — 39 items
APPLIER CLIP 5 13 M/L LIGAMAX5 (MISCELLANEOUS)
APPLIER CLIP ROT 10 11.4 M/L (STAPLE)
APR CLP MED LRG 11.4X10 (STAPLE)
APR CLP MED LRG 5 ANG JAW (MISCELLANEOUS)
BAG SPEC RTRVL LRG 6X4 10 (ENDOMECHANICALS) ×1
CABLE HIGH FREQUENCY MONO STRZ (ELECTRODE) IMPLANT
CLIP APPLIE 5 13 M/L LIGAMAX5 (MISCELLANEOUS) IMPLANT
CLIP APPLIE ROT 10 11.4 M/L (STAPLE) IMPLANT
COVER MAYO STAND STRL (DRAPES) ×3 IMPLANT
COVER SURGICAL LIGHT HANDLE (MISCELLANEOUS) ×3 IMPLANT
DECANTER SPIKE VIAL GLASS SM (MISCELLANEOUS) ×3 IMPLANT
DRAPE C-ARM 42X120 X-RAY (DRAPES) ×3 IMPLANT
DRAPE LAPAROSCOPIC ABDOMINAL (DRAPES) ×3 IMPLANT
DRAPE UTILITY XL STRL (DRAPES) ×3 IMPLANT
DRAPE WARM FLUID 44X44 (DRAPE) ×3 IMPLANT
DRSG TEGADERM 2-3/8X2-3/4 SM (GAUZE/BANDAGES/DRESSINGS) ×5 IMPLANT
DRSG TEGADERM 4X4.75 (GAUZE/BANDAGES/DRESSINGS) ×3 IMPLANT
ELECT REM PT RETURN 9FT ADLT (ELECTROSURGICAL) ×3
ELECTRODE REM PT RTRN 9FT ADLT (ELECTROSURGICAL) ×1 IMPLANT
ENDOLOOP SUT PDS II  0 18 (SUTURE)
ENDOLOOP SUT PDS II 0 18 (SUTURE) IMPLANT
GAUZE SPONGE 2X2 8PLY STRL LF (GAUZE/BANDAGES/DRESSINGS) IMPLANT
GLOVE ECLIPSE 8.0 STRL XLNG CF (GLOVE) ×3 IMPLANT
GLOVE INDICATOR 8.0 STRL GRN (GLOVE) ×3 IMPLANT
GOWN STRL REUS W/TWL XL LVL3 (GOWN DISPOSABLE) ×6 IMPLANT
KIT BASIN OR (CUSTOM PROCEDURE TRAY) ×3 IMPLANT
POUCH SPECIMEN RETRIEVAL 10MM (ENDOMECHANICALS) ×2 IMPLANT
SCISSORS LAP 5X35 DISP (ENDOMECHANICALS) ×3 IMPLANT
SET CHOLANGIOGRAPH MIX (MISCELLANEOUS) ×3 IMPLANT
SET IRRIG TUBING LAPAROSCOPIC (IRRIGATION / IRRIGATOR) ×3 IMPLANT
SLEEVE XCEL OPT CAN 5 100 (ENDOMECHANICALS) IMPLANT
SPONGE GAUZE 2X2 STER 10/PKG (GAUZE/BANDAGES/DRESSINGS) ×2
SUT MNCRL AB 4-0 PS2 18 (SUTURE) ×3 IMPLANT
SYR 20CC LL (SYRINGE) ×3 IMPLANT
TOWEL OR 17X26 10 PK STRL BLUE (TOWEL DISPOSABLE) ×3 IMPLANT
TOWEL OR NON WOVEN STRL DISP B (DISPOSABLE) ×3 IMPLANT
TRAY LAPAROSCOPIC (CUSTOM PROCEDURE TRAY) ×3 IMPLANT
TROCAR BLADELESS OPT 5 100 (ENDOMECHANICALS) ×3 IMPLANT
TROCAR XCEL NON-BLD 11X100MML (ENDOMECHANICALS) ×3 IMPLANT

## 2015-04-28 NOTE — Anesthesia Procedure Notes (Signed)
Procedure Name: Intubation Date/Time: 04/28/2015 7:24 AM Performed by: Leroy Libman L Patient Re-evaluated:Patient Re-evaluated prior to inductionOxygen Delivery Method: Circle system utilized Preoxygenation: Pre-oxygenation with 100% oxygen Intubation Type: IV induction Ventilation: Mask ventilation without difficulty and Oral airway inserted - appropriate to patient size Laryngoscope Size: Miller and 3 Grade View: Grade I Tube type: Oral Tube size: 8.0 mm Number of attempts: 1 Airway Equipment and Method: Stylet Placement Confirmation: ETT inserted through vocal cords under direct vision,  breath sounds checked- equal and bilateral and positive ETCO2 Secured at: 21 cm Tube secured with: Tape Dental Injury: Teeth and Oropharynx as per pre-operative assessment

## 2015-04-28 NOTE — Progress Notes (Signed)
I updated the patient's status to the patient & the patient's spouse.  CBD obstruction most likely from stones.  D/w Dr Dulce Sellar w [facility] - he will find out which GI MD will see him to see if ERCP needed, at least follow.  Wife > husband frustrated he won't get out quickly, wife commented that she had her gallbladder out & knows about all this.  Initally hostile but then calmed down.  D/w Colin Mulders RN.  Ardeth Sportsman, M.D., F.A.C.S. Gastrointestinal [facility] Minimally Invasive Surgery [facility] [address] [place], [place] [address] [phone] Main / Paging

## 2015-04-28 NOTE — Anesthesia Postprocedure Evaluation (Signed)
  Anesthesia Post-op Note  Patient: Manuel Anderson  Procedure(s) Performed: Procedure(s) (LRB): LAPAROSCOPIC CHOLECYSTECTOMY WITH INTRAOPERATIVE CHOLANGIOGRAM (N/A)  Patient Location: PACU  Anesthesia Type: General  Level of Consciousness: awake and alert   Airway and Oxygen Therapy: Patient Spontanous Breathing  Post-op Pain: mild  Post-op Assessment: Post-op Vital signs reviewed, Patient's Cardiovascular Status Stable, Respiratory Function Stable, Patent Airway and No signs of Nausea or vomiting  Last Vitals:  Filed Vitals:   04/28/15 1000  BP: 135/67  Pulse: 60  Temp: 36.7 C  Resp: 14    Post-op Vital Signs: stable   Complications: No apparent anesthesia complications

## 2015-04-28 NOTE — Discharge Instructions (Signed)
LAPAROSCOPIC SURGERY: POST OP INSTRUCTIONS ° °1. DIET: Follow a light bland diet the first 24 hours after arrival home, such as soup, liquids, crackers, etc.  Be sure to include lots of fluids daily.  Avoid fast food or heavy meals as your are more likely to get nauseated.  Eat a low fat the next few days after surgery.   °2. Take your usually prescribed home medications unless otherwise directed. °3. PAIN CONTROL: °a. Pain is best controlled by a usual combination of three different methods TOGETHER: °i. Ice/Heat °ii. Over the counter pain medication °iii. Prescription pain medication °b. Most patients will experience some swelling and bruising around the incisions.  Ice packs or heating pads (30-60 minutes up to 6 times a day) will help. Use ice for the first few days to help decrease swelling and bruising, then switch to heat to help relax tight/sore spots and speed recovery.  Some people prefer to use ice alone, heat alone, alternating between ice & heat.  Experiment to what works for you.  Swelling and bruising can take several weeks to resolve.   °c. It is helpful to take an over-the-counter pain medication regularly for the first few weeks.  Choose one of the following that works best for you: °i. Naproxen (Aleve, etc)  Two 220mg tabs twice a day °ii. Ibuprofen (Advil, etc) Three 200mg tabs four times a day (every meal & bedtime) °iii. Acetaminophen (Tylenol, etc) 500-650mg four times a day (every meal & bedtime) °d. A  prescription for pain medication (such as oxycodone, hydrocodone, etc) should be given to you upon discharge.  Take your pain medication as prescribed.  °i. If you are having problems/concerns with the prescription medicine (does not control pain, nausea, vomiting, rash, itching, etc), please call us [phone] to see if we need to switch you to a different pain medicine that will work better for you and/or control your side effect better. °ii. If you need a refill on your pain medication,  please contact your pharmacy.  They will contact our office to request authorization. Prescriptions will not be filled after 5 pm or on week-ends. °4. Avoid getting constipated.  Between the surgery and the pain medications, it is common to experience some constipation.  Increasing fluid intake and taking a fiber supplement (such as Metamucil, Citrucel, FiberCon, MiraLax, etc) 1-2 times a day regularly will usually help prevent this problem from occurring.  A mild laxative (prune juice, Milk of Magnesia, MiraLax, etc) should be taken according to package directions if there are no bowel movements after 48 hours.   °5. Watch out for diarrhea.  If you have many loose bowel movements, simplify your diet to bland foods & liquids for a few days.  Stop any stool softeners and decrease your fiber supplement.  Switching to mild anti-diarrheal medications (Kayopectate, Pepto Bismol) can help.  If this worsens or does not improve, please call us. °6. Wash / shower every day.  You may shower over the dressings as they are waterproof.  Continue to shower over incision(s) after the dressing is off. °7. Remove your waterproof bandages 5 days after surgery.  You may leave the incision open to air.  You may replace a dressing/Band-Aid to cover the incision for comfort if you wish.  °8. ACTIVITIES as tolerated:   °a. You may resume regular (light) daily activities beginning the next day--such as daily self-care, walking, climbing stairs--gradually increasing activities as tolerated.  If you can walk 30 minutes without difficulty, it   is safe to try more intense activity such as jogging, treadmill, bicycling, low-impact aerobics, swimming, etc. b. Save the most intensive and strenuous activity for last such as sit-ups, heavy lifting, contact sports, etc  Refrain from any heavy lifting or straining until you are off narcotics for pain control.   c. DO NOT PUSH THROUGH PAIN.  Let pain be your guide: If it hurts to do something, don't  do it.  Pain is your body warning you to avoid that activity for another week until the pain goes down. d. You may drive when you are no longer taking prescription pain medication, you can comfortably wear a seatbelt, and you can safely maneuver your car and apply brakes. e. Dennis Bast may have sexual intercourse when it is comfortable.  9. FOLLOW UP in our office a. Please call [facility] at [phone] to set up an appointment to see your surgeon in the office for a follow-up appointment approximately 2-3 weeks after your surgery. b. Make sure that you call for this appointment the day you arrive home to insure a convenient appointment time. 10. IF YOU HAVE DISABILITY OR FAMILY LEAVE FORMS, BRING THEM TO THE OFFICE FOR PROCESSING.  DO NOT GIVE THEM TO YOUR DOCTOR.   WHEN TO CALL us [phone]: 1. Poor pain control 2. Reactions / problems with new medications (rash/itching, nausea, etc)  3. Fever over 101.5 F (38.5 C) 4. Inability to urinate 5. Nausea and/or vomiting 6. Worsening swelling or bruising 7. Continued bleeding from incision. 8. Increased pain, redness, or drainage from the incision   The clinic staff is available to answer your questions during regular business hours (8:30am-5pm).  Please dont hesitate to call and ask to speak to one of our nurses for clinical concerns.   If you have a medical emergency, go to the nearest emergency room or call 911.  A surgeon from [facility] is always on call at the [facility], [address], [place], [place], [place]  [address] ? MAIN: [phone] ? TOLL FREE: [phone] ?  FAX [phone] [email or web address]   Cholecystitis Cholecystitis is an inflammation of your gallbladder. It is usually caused by a buildup of gallstones or sludge (cholelithiasis) in your gallbladder. The gallbladder stores a fluid that helps digest fats (bile). Cholecystitis is serious and needs  treatment right away.  CAUSES   Gallstones. Gallstones can block the tube that leads to your gallbladder, causing bile to build up. As bile builds up, the gallbladder becomes inflamed.  Bile duct problems, such as blockage from scarring or kinking.  Tumors. Tumors can stop bile from leaving your gallbladder correctly, causing bile to build up. As bile builds up, the gallbladder becomes inflamed. SYMPTOMS   Nausea.  Vomiting.  Abdominal pain, especially in the upper right area of your abdomen.  Abdominal tenderness or bloating.  Sweating.  Chills.  Fever.  Yellowing of the skin and the whites of the eyes (jaundice). DIAGNOSIS  Your caregiver may order blood tests to look for infection or gallbladder problems. Your caregiver may also order imaging tests, such as an ultrasound or computed tomography (CT) scan. Further tests may include a hepatobiliary iminodiacetic acid (HIDA) scan. This scan allows your caregiver to see your bile move from the liver to the gallbladder and to the small intestine. TREATMENT  A hospital stay is usually necessary to lessen the inflammation of your gallbladder. You may be required to not eat or drink (fast) for  a certain amount of time. You may be given medicine to treat pain or an antibiotic medicine to treat an infection. Surgery may be needed to remove your gallbladder (cholecystectomy) once the inflammation has gone down. Surgery may be needed right away if you develop complications such as death of gallbladder tissue (gangrene) or a tear (perforation) of the gallbladder.  [facility] will depend on your treatment. In general:  If you were given antibiotics, take them as directed. Finish them even if you start to feel better.  Only take over-the-counter or prescription medicines for pain, discomfort, or fever as directed by your caregiver.  Follow a low-fat diet until you see your caregiver again.  Keep all follow-up visits as  directed by your caregiver. SEEK IMMEDIATE MEDICAL CARE IF:   Your pain is increasing and not controlled by medicines.  Your pain moves to another part of your abdomen or to your back.  You have a fever.  You have nausea and vomiting. MAKE SURE YOU:  Understand these instructions.  Will watch your condition.  Will get help right away if you are not doing well or get worse. Document Released: 07/21/2005 Document Revised: 10/13/2011 Document Reviewed: 06/06/2011 [facility] Patient Information 2015 [place], [place]. This information is not intended to replace advice given to you by your health care provider. Make sure you discuss any questions you have with your health care provider.  Managing Pain  Pain after surgery or related to activity is often due to strain/injury to muscle, tendon, nerves and/or incisions.  This pain is usually short-term and will improve in a few months.   Many people find it helpful to do the following things TOGETHER to help speed the process of healing and to get back to regular activity more quickly:  1. Avoid heavy physical activity at first a. No lifting greater than 20 pounds at first, then increase to lifting as tolerated over the next few weeks b. Do not push through the pain.  Listen to your body and avoid positions and maneuvers than reproduce the pain.  Wait a few days before trying something more intense c. Walking is okay as tolerated, but go slowly and stop when getting sore.  If you can walk 30 minutes without stopping or pain, you can try more intense activity (running, jogging, aerobics, cycling, swimming, treadmill, sex, sports, weightlifting, etc ) d. Remember: If it hurts to do it, then dont do it!  2. Take Anti-inflammatory medication a. Choose ONE of the following over-the-counter medications: i.            Acetaminophen '500mg'$  tabs (Tylenol) 1-2 pills with every meal and just before bedtime (avoid if you have liver problems) ii.             Naproxen '220mg'$  tabs (ex. Aleve) 1-2 pills twice a day (avoid if you have kidney, stomach, IBD, or bleeding problems) iii. Ibuprofen '200mg'$  tabs (ex. Advil, Motrin) 3-4 pills with every meal and just before bedtime (avoid if you have kidney, stomach, IBD, or bleeding problems) b. Take with food/snack around the clock for 1-2 weeks i. This helps the muscle and nerve tissues become less irritable and calm down faster  3. Use a Heating pad or Ice/Cold Pack a. 4-6 times a day b. May use warm bath/hottub  or showers  4. Try Gentle Massage and/or Stretching  a. at the area of pain many times a day b. stop if you feel pain - do not overdo it  Try these steps together to help you body heal faster and avoid making things get worse.  Doing just one of these things may not be enough.    If you are not getting better after two weeks or are noticing you are getting worse, contact our office for further advice; we may need to re-evaluate you & see what other things we can do to help.  GETTING TO GOOD BOWEL HEALTH. Irregular bowel habits such as constipation and diarrhea can lead to many problems over time.  Having one soft bowel movement a day is the most important way to prevent further problems.  The anorectal canal is designed to handle stretching and feces to safely manage our ability to get rid of solid waste (feces, poop, stool) out of our body.  BUT, hard constipated stools can act like ripping concrete bricks and diarrhea can be a burning fire to this very sensitive area of our body, causing inflamed hemorrhoids, anal fissures, increasing risk is perirectal abscesses, abdominal pain/bloating, an making irritable bowel worse.      The goal: ONE SOFT BOWEL MOVEMENT A DAY!  To have soft, regular bowel movements:   Drink plenty of fluids, consider 4-6 tall glasses of water a day.    Take plenty of fiber.  Fiber is the undigested part of plant food that passes into the colon, acting s natures broom to  encourage bowel motility and movement.  Fiber can absorb and hold large amounts of water. This results in a larger, bulkier stool, which is soft and easier to pass. Work gradually over several weeks up to 6 servings a day of fiber (25g a day even more if needed) in the form of: o Vegetables -- Root (potatoes, carrots, turnips), leafy green (lettuce, salad greens, celery, spinach), or cooked high residue (cabbage, broccoli, etc) o Fruit -- Fresh (unpeeled skin & pulp), Dried (prunes, apricots, cherries, etc ),  or stewed ( applesauce)  o Whole grain breads, pasta, etc (whole wheat)  o Bran cereals   Bulking Agents -- This type of water-retaining fiber generally is easily obtained each day by one of the following:  o Psyllium bran -- The psyllium plant is remarkable because its ground seeds can retain so much water. This product is available as Metamucil, Konsyl, Effersyllium, Per Diem Fiber, or the less expensive generic preparation in drug and health food stores. Although labeled a laxative, it really is not a laxative.  o Methylcellulose -- This is another fiber derived from wood which also retains water. It is available as Citrucel. o Polyethylene Glycol - and artificial fiber commonly called Miralax or Glycolax.  It is helpful for people with gassy or bloated feelings with regular fiber o Flax Seed - a less gassy fiber than psyllium  No reading or other relaxing activity while on the toilet. If bowel movements take longer than 5 minutes, you are too constipated  AVOID CONSTIPATION.  High fiber and water intake usually takes care of this.  Sometimes a laxative is needed to stimulate more frequent bowel movements, but   Laxatives are not a good long-term solution as it can wear the colon out.  They can help jump-start bowels if constipated, but should be relied on constantly without discussing with your doctor o Osmotics (Milk of Magnesia, Fleets phosphosoda, Magnesium citrate, MiraLax, GoLytely)  are safer than  o Stimulants (Senokot, Castor Oil, Dulcolax, Ex Lax)    o Avoid taking laxatives for more than 7 days in a row.  IF SEVERELY CONSTIPATED, try a Bowel Retraining Program: o Do not use laxatives.  o Eat a diet high in roughage, such as bran cereals and leafy vegetables.  o Drink six (6) ounces of prune or apricot juice each morning.  o Eat two (2) large servings of stewed fruit each day.  o Take one (1) heaping tablespoon of a psyllium-based bulking agent twice a day. Use sugar-free sweetener when possible to avoid excessive calories.  o Eat a normal breakfast.  o Set aside 15 minutes after breakfast to sit on the toilet, but do not strain to have a bowel movement.  o If you do not have a bowel movement by the third day, use an enema and repeat the above steps.   Controlling diarrhea o Switch to liquids and simpler foods for a few days to avoid stressing your intestines further. o Avoid dairy products (especially milk & ice cream) for a short time.  The intestines often can lose the ability to digest lactose when stressed. o Avoid foods that cause gassiness or bloating.  Typical foods include beans and other legumes, cabbage, broccoli, and dairy foods.  Every person has some sensitivity to other foods, so listen to our body and avoid those foods that trigger problems for you. o Adding fiber (Citrucel, Metamucil, psyllium, Miralax) gradually can help thicken stools by absorbing excess fluid and retrain the intestines to act more normally.  Slowly increase the dose over a few weeks.  Too much fiber too soon can backfire and cause cramping & bloating. o Probiotics (such as active yogurt, Align, etc) may help repopulate the intestines and colon with normal bacteria and calm down a sensitive digestive tract.  Most studies show it to be of mild help, though, and such products can be costly. o Medicines: - Bismuth subsalicylate (ex. Kayopectate, Pepto Bismol) every 30 minutes for up to 6  doses can help control diarrhea.  Avoid if pregnant. - Loperamide (Immodium) can slow down diarrhea.  Start with two tablets (  total) first and then try one tablet every 6 hours.  Avoid if you are having fevers or severe pain.  If you are not better or start feeling worse, stop all medicines and call your doctor for advice o Call your doctor if you are getting worse or not better.  Sometimes further testing (cultures, endoscopy, X-ray studies, bloodwork, etc) may be needed to help diagnose and treat the cause of the diarrhea.  TROUBLESHOOTING IRREGULAR BOWELS 1) Avoid extremes of bowel movements (no bad constipation/diarrhea) 2) Miralax 17gm mixed in 8oz. water or juice-daily. May use BID as needed.  3) Gas-x,Phazyme, etc. as needed for gas & bloating.  4) Soft,bland diet. No spicy,greasy,fried foods.  5) Prilosec over-the-counter as needed  6) May hold gluten/wheat products from diet to see if symptoms improve.  7)  May try probiotics (Align, Activa, etc) to help calm the bowels down 7) If symptoms become worse call back immediately.

## 2015-04-28 NOTE — Anesthesia Preprocedure Evaluation (Addendum)
Anesthesia Evaluation  Patient identified by MRN, date of birth, ID band Patient awake    Reviewed: Allergy & Precautions, NPO status , Patient's Chart, lab work & pertinent test results  Airway Mallampati: II  TM Distance: >3 FB Neck ROM: Full    Dental  (+) Edentulous Lower, Edentulous Upper   Pulmonary Current Smoker,  CXR: mild COPD changes.   Pulmonary exam normal breath sounds clear to auscultation       Cardiovascular Exercise Tolerance: Good negative cardio ROS Normal cardiovascular exam Rhythm:Regular Rate:Normal  ECG: nonspecific T wave changes.   Neuro/Psych C/o headaches negative neurological ROS  negative psych ROS   GI/Hepatic negative GI ROS, Neg liver ROS,   Endo/Other  negative endocrine ROS  Renal/GU negative Renal ROS  negative genitourinary   Musculoskeletal negative musculoskeletal ROS (+)   Abdominal   Peds negative pediatric ROS (+)  Hematology negative hematology ROS (+)   Anesthesia Other Findings   Reproductive/Obstetrics negative OB ROS                            Anesthesia Physical Anesthesia Plan  ASA: II and emergent  Anesthesia Plan: General   Post-op Pain Management:    Induction: Intravenous  Airway Management Planned: Oral ETT  Additional Equipment:   Intra-op Plan:   Post-operative Plan: Extubation in OR  Informed Consent: I have reviewed the patients History and Physical, chart, labs and discussed the procedure including the risks, benefits and alternatives for the proposed anesthesia with the patient or authorized representative who has indicated his/her understanding and acceptance.   Dental advisory given  Plan Discussed with: CRNA  Anesthesia Plan Comments:         Anesthesia Quick Evaluation

## 2015-04-28 NOTE — Transfer of Care (Signed)
Immediate Anesthesia Transfer of Care Note  Patient: Manuel Anderson  Procedure(s) Performed: Procedure(s): LAPAROSCOPIC CHOLECYSTECTOMY WITH INTRAOPERATIVE CHOLANGIOGRAM (N/A)  Patient Location: PACU  Anesthesia Type:General  Level of Consciousness: awake  Airway & Oxygen Therapy: Patient Spontanous Breathing and Patient connected to face mask oxygen  Post-op Assessment: Report given to RN and Post -op Vital signs reviewed and stable  Post vital signs: Reviewed and stable  Last Vitals:  Filed Vitals:   04/28/15 0600  BP: 106/61  Pulse: 62  Temp: 36.7 C  Resp: 18    Complications: No apparent anesthesia complications

## 2015-04-28 NOTE — Consult Note (Signed)
Unassigned patient Reason for Consult: Positive IOC after laparoscopic cholecystectomy. Referring Physician: CCS.  Manuel Anderson is an 54 y.o. male.  HPI: 54 year old white male, presented to the hospital with a 2 mont history of RUQ pain and nausea, worse post-prandially, found to have calculous cholecystitis on imaging and had a laparoscopic cholecystectomy today. IOC was postive for distal CBD stones.  History reviewed. No pertinent past medical history.  History reviewed. No pertinent past surgical history.  History reviewed. No pertinent family history.  Social History:  reports that he has been smoking Cigarettes.  He has been smoking about 1.25 packs per day. He does not have any smokeless tobacco history on file. He reports that he does not drink alcohol or use illicit drugs.  Allergies: No Known Allergies  Medications: I have reviewed the patient's current medications.  Results for orders placed or performed during the hospital encounter of 04/27/15 (from the past 48 hour(s))  Urinalysis, Routine w reflex microscopic (not at [facility])     Status: Abnormal   Collection Time: 04/27/15  2:20 PM  Result Value Ref Range   Color, Urine AMBER (A) YELLOW    Comment: BIOCHEMICALS MAY BE AFFECTED BY COLOR   APPearance CLEAR CLEAR   Specific Gravity, Urine 1.014 1.005 - 1.030   pH 8.0 5.0 - 8.0   Glucose, UA NEGATIVE NEGATIVE mg/dL   Hgb urine dipstick NEGATIVE NEGATIVE   Bilirubin Urine SMALL (A) NEGATIVE   Ketones, ur NEGATIVE NEGATIVE mg/dL   Protein, ur NEGATIVE NEGATIVE mg/dL   Urobilinogen, UA 1.0 0.0 - 1.0 mg/dL   Nitrite NEGATIVE NEGATIVE   Leukocytes, UA NEGATIVE NEGATIVE    Comment: MICROSCOPIC NOT DONE ON URINES WITH NEGATIVE PROTEIN, BLOOD, LEUKOCYTES, NITRITE, OR GLUCOSE <1000 mg/dL.  Lipase, blood     Status: None   Collection Time: 04/27/15  2:41 PM  Result Value Ref Range   Lipase 28 22 - 51 U/L  Comprehensive metabolic panel     Status: Abnormal   Collection  Time: 04/27/15  2:41 PM  Result Value Ref Range   Sodium 132 (L) 135 - 145 mmol/L   Potassium 3.7 3.5 - 5.1 mmol/L   Chloride 99 (L) 101 - 111 mmol/L   CO2 27 22 - 32 mmol/L   Glucose, Bld 129 (H) 65 - 99 mg/dL   BUN <5 (L) 6 - 20 mg/dL   Creatinine, Ser 0.80 0.61 - 1.24 mg/dL   Calcium 9.2 8.9 - 10.3 mg/dL   Total Protein 7.4 6.5 - 8.1 g/dL   Albumin 4.4 3.5 - 5.0 g/dL   AST 218 (H) 15 - 41 U/L   ALT 287 (H) 17 - 63 U/L   Alkaline Phosphatase 148 (H) 38 - 126 U/L   Total Bilirubin 1.4 (H) 0.3 - 1.2 mg/dL   GFR calc non Af Amer >60 >60 mL/min   GFR calc Af Amer >60 >60 mL/min    Comment: (NOTE) The eGFR has been calculated using the CKD EPI equation. This calculation has not been validated in all clinical situations. eGFR's persistently <60 mL/min signify possible Chronic Kidney Disease.    Anion gap 6 5 - 15  CBC     Status: None   Collection Time: 04/27/15  2:41 PM  Result Value Ref Range   WBC 8.2 4.0 - 10.5 K/uL   RBC 4.67 4.22 - 5.81 MIL/uL   Hemoglobin 15.7 13.0 - 17.0 g/dL   HCT 43.7 39.0 - 52.0 %   MCV  93.6 78.0 - 100.0 fL   MCH 33.6 26.0 - 34.0 pg   MCHC 35.9 30.0 - 36.0 g/dL   RDW 14.1 11.5 - 15.5 %   Platelets 298 150 - 400 K/uL  Hepatic function panel     Status: Abnormal   Collection Time: 04/28/15  4:34 AM  Result Value Ref Range   Total Protein 6.7 6.5 - 8.1 g/dL   Albumin 3.7 3.5 - 5.0 g/dL   AST 204 (H) 15 - 41 U/L   ALT 295 (H) 17 - 63 U/L   Alkaline Phosphatase 178 (H) 38 - 126 U/L   Total Bilirubin 2.6 (H) 0.3 - 1.2 mg/dL   Bilirubin, Direct 2.0 (H) 0.1 - 0.5 mg/dL   Indirect Bilirubin 0.6 0.3 - 0.9 mg/dL  CBC     Status: None   Collection Time: 04/28/15  4:34 AM  Result Value Ref Range   WBC 7.8 4.0 - 10.5 K/uL   RBC 4.38 4.22 - 5.81 MIL/uL   Hemoglobin 14.5 13.0 - 17.0 g/dL   HCT 41.2 39.0 - 52.0 %   MCV 94.1 78.0 - 100.0 fL   MCH 33.1 26.0 - 34.0 pg   MCHC 35.2 30.0 - 36.0 g/dL   RDW 14.2 11.5 - 15.5 %   Platelets 277 150 - 400 K/uL     Dg Chest 2 View  04/27/2015   CLINICAL DATA:  Abdominal pain.  Smoker.  EXAM: CHEST  2 VIEW  COMPARISON:  06/26/2007.  FINDINGS: Normal sized heart. Clear lungs. The lungs are mildly hyperexpanded with mild diffuse peribronchial thickening. Mild thoracic spine degenerative changes and mild scoliosis.  IMPRESSION: No acute abnormality.  Mild changes of COPD and chronic bronchitis.   Electronically Signed   By: Claudie Revering M.D.   On: 04/27/2015 18:56   Dg Cholangiogram Operative  04/28/2015   CLINICAL DATA:  54 year old male with a history of acute cholecystitis and intraoperative cholangiogram  EXAM: INTRAOPERATIVE CHOLANGIOGRAM  TECHNIQUE: Cholangiographic images from the C-arm fluoroscopic device were submitted for interpretation post-operatively. Please see the procedural report for the amount of contrast and the fluoroscopy time utilized.  COMPARISON:  Ultrasound 04/27/2015  FINDINGS: Surgical instruments project over the upper abdomen.  There is cannulation of the cystic duct/gallbladder neck, with antegrade infusion of contrast. Caliber of the extrahepatic ductal system within normal limits.  Vague filling defects at the distal common bile duct may represent gas, debris, or stones.  Contrast does not cross the ampulla into the duodenum.  IMPRESSION: Intraoperative cholangiogram demonstrates extrahepatic biliary ducts of unremarkable caliber, with vague filling defects at the distal common bile duct either representing debris, air, or stones. Contrast does not cross the ampulla into the duodenum on the final images.  Please refer to the dictated operative report for full details of intraoperative findings and procedure.  Signed,  Dulcy Fanny. Earleen Newport, DO  [facility]  [facility]   Electronically Signed   By: Corrie Mckusick D.O.   On: 04/28/2015 08:43   US Abdomen Complete  04/27/2015   CLINICAL DATA:  Right upper quadrant for 2 days. Increased liver  function tests.  EXAM: ULTRASOUND ABDOMEN COMPLETE  COMPARISON:  CT of the abdomen dated 06/26/2007  FINDINGS: Gallbladder: The gallbladder bladder is partially collapsed and therefore suboptimally evaluated, however there is thickening of the gallbladder wall to 9 mm, which is found to be abnormal even for its decompressed state. There are areas of echogenicity, with associated dense posterior acoustic shadowing  likely representing gallstones, the largest of which measures 8 mm.  Common bile duct: Diameter: 8 mm, enlarged.  Liver: No focal lesion identified. Within normal limits in parenchymal echogenicity.  IVC: No abnormality visualized.  Pancreas: Visualized portion unremarkable.  Spleen: Size and appearance within normal limits.  Right Kidney: Length: 11.1 cm. Echogenicity within normal limits. No mass or hydronephrosis visualized.  Left Kidney: Length: 11.5 cm. Echogenicity within normal limits. No mass or hydronephrosis visualized.  Abdominal aorta: No aneurysm visualized. Abdominal aortic ectasia is noted. Maximum diameter of 2.1 cm is recorded.  Other findings: None.  IMPRESSION: Cholelithiasis with grossly abnormal appearance of the gallbladder wall, concerning for acute cholecystitis.  Borderline enlargement of the common bile duct.   Electronically Signed   By: Fidela Salisbury M.D.   On: 04/27/2015 17:08   Review of Systems  Unable to perform ROS Constitutional: Negative.   HENT: Negative.   Eyes: Negative.   Respiratory: Negative.   Cardiovascular: Negative.   Gastrointestinal: Positive for abdominal pain.  Genitourinary: Negative.   Skin: Negative.   Neurological: Negative.   Endo/Heme/Allergies: Negative.   Psychiatric/Behavioral: Positive for substance abuse.   Blood pressure 135/67, pulse 60, temperature 98 F (36.7 C), temperature source Oral, resp. rate 14, height $RemoveBe'5\' 9"'dNLOhjPea$  (1.753 m), weight 74.889 kg (165 lb 1.6 oz), SpO2 100 %. Physical Exam  Constitutional: He appears  well-developed and well-nourished.  HENT:  Head: Normocephalic and atraumatic.  Eyes: Conjunctivae and EOM are normal.  Neck: Normal range of motion. Neck supple.  Cardiovascular: Normal rate and regular rhythm.   Respiratory: Effort normal and breath sounds normal.  GI:  abdomen not examined as the patient just had a laparoscopic cholecystectomy   Assessment/Plan: 1) Acute calculous cholecystitis;s/p Laparoscopic cholecystectomy with ?distal CBD stones on IOC-will watch his LFT's and recheck them tomorrow before planning an ERCP.  2) Tobacco abuse. MANN,JYOTHI 04/28/2015, 10:22 AM

## 2015-04-28 NOTE — Op Note (Signed)
04/28/2015  8:51 AM  PATIENT:  Manuel Anderson  54 y.o. male  Patient Care Team: Leonard Downing, MD as PCP - General (Family Medicine)  PRE-OPERATIVE DIAGNOSIS:  Acute cholecystitis  POST-OPERATIVE DIAGNOSIS:    Acute cholecystitis Cholecystolithiasis Obstructing choledoclithiasis  PROCEDURE:  Procedure(s): LAPAROSCOPIC CHOLECYSTECTOMY WITH INTRAOPERATIVE CHOLANGIOGRAM Wedge liver biopsy  Surgeon(s): Michael Boston, MD Armandina Gemma, MD - Assist  ANESTHESIA:   local and general  EBL:  Total I/O In: 1000 [I.V.:1000] Out: 100 [Blood:100]  Delay start of Pharmacological VTE agent (>24hrs) due to surgical blood loss or risk of bleeding:  no  DRAINS: none   SPECIMEN:  Source of Specimen:  Gallbladder  DISPOSITION OF SPECIMEN:  PATHOLOGY  COUNTS:  YES  PLAN OF CARE: Admit to inpatient   PATIENT DISPOSITION:  PACU - hemodynamically stable.  INDICATION: Smoking male with recurrent episodes of upper nominal pain.  Now more focused in the right upper quadrant and constant for at least the past 24 hours if not longer.  Evidence of gallbladder wall thickening and Murphy sign and stones.  Mildly elevated liver function tests.  Suspicious for cholecystitis.  I recommended laparoscopic cholecystectomy with cholangiogram.  The anatomy & physiology of hepatobiliary & pancreatic function was discussed.  The pathophysiology of gallbladder dysfunction was discussed.  Natural history risks without surgery was discussed.   I feel the risks of no intervention will lead to serious problems that outweigh the operative risks; therefore, I recommended cholecystectomy to remove the pathology.  I explained laparoscopic techniques with possible need for an open approach.  Probable cholangiogram to evaluate the bilary tract was explained as well.    Risks such as bleeding, infection, abscess, leak, injury to other organs, need for further treatment, heart attack, death, and other risks were  discussed.  I noted a good likelihood this will help address the problem.  Possibility that this will not correct all abdominal symptoms was explained.  Goals of post-operative recovery were discussed as well.  We will work to minimize complications.  An educational handout further explaining the pathology and treatment options was given as well.  Questions were answered.  The patient expresses understanding & wishes to proceed with surgery.  OR FINDINGS: Moderate gallbladder wall thickening consistent with acute on chronic cholecystitis.  Narrowed cystic duct.    Common bile duct not massively dilated but cluster of lucencies very suspicious in distal CBD.  Resistant to more than 20 mL infusion.  No resolution with glucagon.  Gallbladder full of small stones with very thick paste.  Strongly suspicious for obstructing choledocholithiasis.  DESCRIPTION:   The patient was identified & brought into the operating room. The patient was positioned supine with arms tucked. SCDs were active during the entire case. The patient underwent general anesthesia without any difficulty.  The abdomen was prepped and draped in a sterile fashion. A Surgical Timeout confirmed our plan.  We positioned the patient in reverse [place] & right side up.  I placed a 32m laparoscopic port through the abdominal wall using optical entry technique in the right upper quadrant.  Entry was clean.  We induced carbon dioxide insufflation. Camera inspection revealed no injury. There were no adhesions to the anterior abdominal wall supraumbilically.  I proceeded to continue with laparoscopic technique. I placed a #5 port in supraumbilical region, another 518mport in the right flank near the anterior axillary line, and a 1037mort in the left subxiphoid region obliquely within the falciform ligament.  I turned attention to  the right upper quadrant.  Gallbladder was very thickened but no major adhesions to it.  The gallbladder fundus was  elevated cephalad. I used hook cautery to free the peritoneal coverings between the gallbladder and the liver on the posteriolateral and anteriomedial walls.   The visceroperitoneum was very inflamed and veins and vessels quite engorged with some bleeding.  I used careful blunt and hook dissection to help get a good critical view of the cystic artery and cystic duct. I did further dissection to free the proximal half of the gallbladder off the liver bed to get a good critical view of the infundibulum and cystic duct. I mobilized the cystic artery; and, after getting a good 360 view, ligated the cystic artery using clips.  Posterior branch isolated and clipped as well.  I skeletonized the cystic duct.  I placed a clip on the infundibulum. I did a partial cystic duct-otomy and ensured patency. I placed a 5 [place] cholangiocatheter through a puncture site at the right subcostal ridge of the abdominal wall and directed it into the cystic duct.  We ran a cholangiogram with dilute radio-opaque contrast and continuous fluoroscopy. Contrast flowed from a side branch consistent with cystic duct cannulization. Contrast flowed up the common hepatic duct into the right and left intrahepatic chains out to secondary radicals. Contrast flowed down the common bile duct but stopped with reverse meniscus sign.  Tried to flush the morbid met resistance after 20 mils total.  Repeat cholangiogram showed possible small wisp of contrast but nothing into the duodenum.  I had anesthesia administer glucagon intravenously.  After a few minutes flushing did not improve and there seemed more suspicious cluster of hypolucent since he is somewhat mobile in the distal common bile duct.  Again no contrast in the duodenum.  Suspicious for obstructing distal choledocholithiasis.    I removed the cholangiocatheter. I placed clips on the cystic duct x4.  I completed cystic duct transection. I freed the gallbladder from its remaining attachments  to the liver.  Gallbladder was somewhat intrahepatic so I did have to take a wedge of liver off with it I ensured hemostasis on the gallbladder fossa of the liver and elsewhere. I inspected the rest of the abdomen & detected no injury nor bleeding elsewhere.  I removed the gallbladder inside an Endo Catch bag.  I had to open up the fascia to 3 cm to get the very thickened gallbladder out.  Gallbladder full with a very dense thick paste with numerous 2-5 millimeter stones.  All clustered into one giant thick pasty mass.  I closed the subxiphoid fascia transversely using 0 PDS interrupted stitches. I closed the skin using 4-0 monocryl stitch.  Sterile dressings were applied. The patient was extubated & arrived in the PACU in stable condition..  I had discussed postoperative care with the patient in the holding area.  I am about to locate the patient's family and discuss operative findings and postoperative goals / instructions.  Instructions are written in the chart as well.  I am trying to reach gastroenterology on call.  I believe it is equal gastroenterology and Dr. will all all.  I think the patient will require ERCP.  Bilirubin is up and IOC gram strongly suspicious for choledocholithiasis.  We will see if they agree or wish to follow clinically, get MRCP, etc.  Adin Hector, M.D., F.A.C.S. Gastrointestinal and Minimally Invasive Surgery [facility] [address] [place], [place] [address] [phone] Main /  Paging

## 2015-04-29 LAB — CBC
HCT: 38.5 % — ABNORMAL LOW (ref 39.0–52.0)
Hemoglobin: 13.4 g/dL (ref 13.0–17.0)
MCH: 33 pg (ref 26.0–34.0)
MCHC: 34.8 g/dL (ref 30.0–36.0)
MCV: 94.8 fL (ref 78.0–100.0)
PLATELETS: 275 10*3/uL (ref 150–400)
RBC: 4.06 MIL/uL — AB (ref 4.22–5.81)
RDW: 14.6 % (ref 11.5–15.5)
WBC: 12.1 10*3/uL — AB (ref 4.0–10.5)

## 2015-04-29 LAB — COMPREHENSIVE METABOLIC PANEL
ALT: 438 U/L — AB (ref 17–63)
AST: 302 U/L — AB (ref 15–41)
Albumin: 4 g/dL (ref 3.5–5.0)
Alkaline Phosphatase: 195 U/L — ABNORMAL HIGH (ref 38–126)
Anion gap: 9 (ref 5–15)
BUN: 7 mg/dL (ref 6–20)
CHLORIDE: 99 mmol/L — AB (ref 101–111)
CO2: 27 mmol/L (ref 22–32)
CREATININE: 0.76 mg/dL (ref 0.61–1.24)
Calcium: 9.2 mg/dL (ref 8.9–10.3)
GFR calc non Af Amer: >60 mL/min (ref >60)
Glucose, Bld: 133 mg/dL — ABNORMAL HIGH (ref 65–99)
Potassium: 4.5 mmol/L (ref 3.5–5.1)
SODIUM: 135 mmol/L (ref 135–145)
Total Bilirubin: 2.9 mg/dL — ABNORMAL HIGH (ref 0.3–1.2)
Total Protein: 6.8 g/dL (ref 6.5–8.1)

## 2015-04-29 LAB — LIPASE, BLOOD: Lipase: 25 U/L (ref 22–51)

## 2015-04-29 MED ORDER — DEXTROSE-NACL 5-0.9 % IV SOLN
INTRAVENOUS | Status: DC
  Administered 2015-04-29 – 2015-04-30 (×2): via INTRAVENOUS

## 2015-04-29 MED ORDER — ACETAMINOPHEN 650 MG RE SUPP: 650.0000 mg | Freq: Four times a day (QID) | RECTAL | Status: DC | PRN

## 2015-04-29 MED ORDER — HYDROMORPHONE HCL 1 MG/ML IJ SOLN
0.5000 mg | INTRAMUSCULAR | Status: DC | PRN
  Administered 2015-04-30: 1 mg via INTRAVENOUS
  Filled 2015-04-29: qty 1

## 2015-04-29 MED ORDER — SACCHAROMYCES BOULARDII 250 MG PO CAPS
250.0000 mg | ORAL_CAPSULE | Freq: Two times a day (BID) | ORAL | Status: DC
  Administered 2015-04-29 – 2015-04-30 (×3): 250 mg via ORAL
  Filled 2015-04-29 (×8): qty 1

## 2015-04-29 MED ORDER — NAPROXEN 500 MG PO TABS
500.0000 mg | ORAL_TABLET | Freq: Two times a day (BID) | ORAL | Status: DC
  Administered 2015-04-29 (×2): 500 mg via ORAL
  Filled 2015-04-29 (×7): qty 1

## 2015-04-29 MED ORDER — ACETAMINOPHEN 325 MG PO TABS
325.0000 mg | ORAL_TABLET | Freq: Four times a day (QID) | ORAL | Status: DC | PRN
Start: 2015-04-29 — End: 2015-05-01

## 2015-04-29 NOTE — Progress Notes (Signed)
[facility]  [address]  [place], [place] [address] Phone: [phone] FAX: [phone]   VALOR KOPPES [id number] 09-02-1960   Problem List:   Principal Problem:   Acute calculous cholecystitis Active Problems:   Tobacco abuse   Abdominal pain, RUQ (right upper quadrant)   1 Day Post-Op  04/28/2015  Procedure(s): LAPAROSCOPIC CHOLECYSTECTOMY WITH INTRAOPERATIVE CHOLANGIOGRAM  Assessment  Stabilizing  Inc LFTs concerning for persistent CBD obstruction from choledocolithiasis  Plan:  -liquids today since bloated w wworsening LFts -prob needs ERCP this admission w worsening LFTs & evid of CBD obstruction on IOC - defer to GI -pain control - hydrocodone working.  Ice/Heat/Naproxen RTC.  Dilaudid for backup -bowel regimen -PPI for now. -ABx 23hr postop -f/u pathology -VTE prophylaxis- SCDs, etc -mobilize as tolerated to help recovery - walking well  Ardeth Sportsman, M.D., F.A.C.S. [facility]astro[facility]imally [facility] [address] [place], [place] [address] [phone] Main / Paging   04/29/2015  Subjective:  Walking well Tol liquids Bloated a little but having flatus "Haven't taken a shit yet, doc"  Objective:  Vital signs:  Filed Vitals:   04/28/15 1446 04/28/15 1825 04/28/15 2211 04/29/15 0604  BP: 127/77 127/72 112/67 120/58  Pulse: 66 61 61 62  Temp: 97.9 F (36.6 C) 98.2 F (36.8 C) 98.4 F (36.9 C) 98.1 F (36.7 C)  TempSrc: Oral Oral Oral Oral  Resp: 18 18 16 16   Height:      Weight:      SpO2: 99% 100% 98% 99%    Last BM Date: 04/27/15  Intake/Output   Yesterday:  09/24 0701 - 09/25 0700 In: 4240 [P.O.:1340; I.V.:2900] Out: 3700 [Urine:3600; Blood:100] This shift:     Bowel function:  Flatus: y  BM: n  Drain: n/a  Physical Exam:  General: Pt awake/alert/oriented x4 in no acute distress Eyes: PERRL, normal EOM.   Sclera clear.  No icterus Neuro: CN II-XII intact w/o focal sensory/motor deficits. Lymph: No head/neck/groin lymphadenopathy Psych:  No delerium/psychosis/paranoia HENT: Normocephalic, Mucus membranes moist.  No thrush Neck: Supple, No tracheal deviation Chest: No chest wall pain w good excursion CV:  Pulses intact.  Regular rhythm MS: Normal AROM mjr joints.  No obvious deformity Abdomen: Soft.  Mod distended.  Mildly tender at incisions only.  No evidence of peritonitis.  No incarcerated hernias. Ext:  SCDs BLE.  No mjr edema.  No cyanosis Skin: No petechiae / purpura  Results:   Labs: Results for orders placed or performed during the hospital encounter of 04/27/15 (from the past 48 hour(s))  Urinalysis, Routine w reflex microscopic (not at [facility])     Status: Abnormal   Collection Time: 04/27/15  2:20 PM  Result Value Ref Range   Color, Urine AMBER (A) YELLOW    Comment: BIOCHEMICALS MAY BE AFFECTED BY COLOR   APPearance CLEAR CLEAR   Specific Gravity, Urine 1.014 1.005 - 1.030   pH 8.0 5.0 - 8.0   Glucose, UA NEGATIVE NEGATIVE mg/dL   Hgb urine dipstick NEGATIVE NEGATIVE   Bilirubin Urine SMALL (A) NEGATIVE   Ketones, ur NEGATIVE NEGATIVE mg/dL   Protein, ur NEGATIVE NEGATIVE mg/dL   Urobilinogen, UA 1.0 0.0 - 1.0 mg/dL   Nitrite NEGATIVE NEGATIVE   Leukocytes, UA NEGATIVE NEGATIVE    Comment: MICROSCOPIC NOT DONE ON URINES WITH NEGATIVE PROTEIN, BLOOD, LEUKOCYTES, NITRITE, OR GLUCOSE <1000 mg/dL.  Lipase, blood     Status: None  Collection Time: 04/27/15  2:41 PM  Result Value Ref Range   Lipase 28 22 - 51 U/L  Comprehensive metabolic panel     Status: Abnormal   Collection Time: 04/27/15  2:41 PM  Result Value Ref Range   Sodium 132 (L) 135 - 145 mmol/L   Potassium 3.7 3.5 - 5.1 mmol/L   Chloride 99 (L) 101 - 111 mmol/L   CO2 27 22 - 32 mmol/L   Glucose, Bld 129 (H) 65 - 99 mg/dL   BUN <5 (L) 6 - 20 mg/dL   Creatinine, Ser 1.61 0.61 - 1.24 mg/dL   Calcium 9.2  8.9 - 09.6 mg/dL   Total Protein 7.4 6.5 - 8.1 g/dL   Albumin 4.4 3.5 - 5.0 g/dL   AST 045 (H) 15 - 41 U/L   ALT 287 (H) 17 - 63 U/L   Alkaline Phosphatase 148 (H) 38 - 126 U/L   Total Bilirubin 1.4 (H) 0.3 - 1.2 mg/dL   GFR calc non Af Amer >60 >60 mL/min   GFR calc Af Amer >60 >60 mL/min    Comment: (NOTE) The eGFR has been calculated using the CKD EPI equation. This calculation has not been validated in all clinical situations. eGFR's persistently <60 mL/min signify possible Chronic Kidney Disease.    Anion gap 6 5 - 15  CBC     Status: None   Collection Time: 04/27/15  2:41 PM  Result Value Ref Range   WBC 8.2 4.0 - 10.5 K/uL   RBC 4.67 4.22 - 5.81 MIL/uL   Hemoglobin 15.7 13.0 - 17.0 g/dL   HCT 40.9 81.1 - 91.4 %   MCV 93.6 78.0 - 100.0 fL   MCH 33.6 26.0 - 34.0 pg   MCHC 35.9 30.0 - 36.0 g/dL   RDW 78.2 95.6 - 21.3 %   Platelets 298 150 - 400 K/uL  Hepatic function panel     Status: Abnormal   Collection Time: 04/28/15  4:34 AM  Result Value Ref Range   Total Protein 6.7 6.5 - 8.1 g/dL   Albumin 3.7 3.5 - 5.0 g/dL   AST 086 (H) 15 - 41 U/L   ALT 295 (H) 17 - 63 U/L   Alkaline Phosphatase 178 (H) 38 - 126 U/L   Total Bilirubin 2.6 (H) 0.3 - 1.2 mg/dL   Bilirubin, Direct 2.0 (H) 0.1 - 0.5 mg/dL   Indirect Bilirubin 0.6 0.3 - 0.9 mg/dL  CBC     Status: None   Collection Time: 04/28/15  4:34 AM  Result Value Ref Range   WBC 7.8 4.0 - 10.5 K/uL   RBC 4.38 4.22 - 5.81 MIL/uL   Hemoglobin 14.5 13.0 - 17.0 g/dL   HCT 57.8 46.9 - 62.9 %   MCV 94.1 78.0 - 100.0 fL   MCH 33.1 26.0 - 34.0 pg   MCHC 35.2 30.0 - 36.0 g/dL   RDW 52.8 41.3 - 24.4 %   Platelets 277 150 - 400 K/uL  Surgical pcr screen     Status: None   Collection Time: 04/28/15  6:19 AM  Result Value Ref Range   MRSA, PCR NEGATIVE NEGATIVE   Staphylococcus aureus NEGATIVE NEGATIVE    Comment:        The Xpert SA Assay (FDA approved for NASAL specimens in patients over 99 years of age), is one  component of a comprehensive surveillance program.  Test performance has been validated by [facility] for patients greater than or equal to  20 year old. It is not intended to diagnose infection nor to guide or monitor treatment.   CBC     Status: Abnormal   Collection Time: 04/29/15  5:15 AM  Result Value Ref Range   WBC 12.1 (H) 4.0 - 10.5 K/uL   RBC 4.06 (L) 4.22 - 5.81 MIL/uL   Hemoglobin 13.4 13.0 - 17.0 g/dL   HCT 40.9 (L) 81.1 - 91.4 %   MCV 94.8 78.0 - 100.0 fL   MCH 33.0 26.0 - 34.0 pg   MCHC 34.8 30.0 - 36.0 g/dL   RDW 78.2 95.6 - 21.3 %   Platelets 275 150 - 400 K/uL  Comprehensive metabolic panel     Status: Abnormal   Collection Time: 04/29/15  5:15 AM  Result Value Ref Range   Sodium 135 135 - 145 mmol/L   Potassium 4.5 3.5 - 5.1 mmol/L    Comment: DELTA CHECK NOTED REPEATED TO VERIFY    Chloride 99 (L) 101 - 111 mmol/L   CO2 27 22 - 32 mmol/L   Glucose, Bld 133 (H) 65 - 99 mg/dL   BUN 7 6 - 20 mg/dL   Creatinine, Ser 0.86 0.61 - 1.24 mg/dL   Calcium 9.2 8.9 - 57.8 mg/dL   Total Protein 6.8 6.5 - 8.1 g/dL   Albumin 4.0 3.5 - 5.0 g/dL   AST 469 (H) 15 - 41 U/L   ALT 438 (H) 17 - 63 U/L   Alkaline Phosphatase 195 (H) 38 - 126 U/L   Total Bilirubin 2.9 (H) 0.3 - 1.2 mg/dL   GFR calc non Af Amer >60 >60 mL/min   GFR calc Af Amer >60 >60 mL/min    Comment: (NOTE) The eGFR has been calculated using the CKD EPI equation. This calculation has not been validated in all clinical situations. eGFR's persistently <60 mL/min signify possible Chronic Kidney Disease.    Anion gap 9 5 - 15  Lipase, blood     Status: None   Collection Time: 04/29/15  5:15 AM  Result Value Ref Range   Lipase 25 22 - 51 U/L    Imaging / Studies: Dg Chest 2 View  04/27/2015   CLINICAL DATA:  Abdominal pain.  Smoker.  EXAM: CHEST  2 VIEW  COMPARISON:  06/26/2007.  FINDINGS: Normal sized heart. Clear lungs. The lungs are mildly hyperexpanded with mild diffuse peribronchial thickening.  Mild thoracic spine degenerative changes and mild scoliosis.  IMPRESSION: No acute abnormality.  Mild changes of COPD and chronic bronchitis.   Electronically Signed   By: Beckie Salts M.D.   On: 04/27/2015 18:56   Dg Cholangiogram Operative  04/28/2015   CLINICAL DATA:  54 year old male with a history of acute cholecystitis and intraoperative cholangiogram  EXAM: INTRAOPERATIVE CHOLANGIOGRAM  TECHNIQUE: Cholangiographic images from the C-arm fluoroscopic device were submitted for interpretation post-operatively. Please see the procedural report for the amount of contrast and the fluoroscopy time utilized.  COMPARISON:  Ultrasound 04/27/2015  FINDINGS: Surgical instruments project over the upper abdomen.  There is cannulation of the cystic duct/gallbladder neck, with antegrade infusion of contrast. Caliber of the extrahepatic ductal system within normal limits.  Vague filling defects at the distal common bile duct may represent gas, debris, or stones.  Contrast does not cross the ampulla into the duodenum.  IMPRESSION: Intraoperative cholangiogram demonstrates extrahepatic biliary ducts of unremarkable caliber, with vague filling defects at the distal common bile duct either representing debris, air, or stones. Contrast does not cross the ampulla  into the duodenum on the final images.  Please refer to the dictated operative report for full details of intraoperative findings and procedure.  Signed,  Yvone Neu. Loreta Ave, DO  [facility]  [facility]   Electronically Signed   By: Gilmer Mor D.O.   On: 04/28/2015 08:43   US Abdomen Complete  04/27/2015   CLINICAL DATA:  Right upper quadrant for 2 days. Increased liver function tests.  EXAM: ULTRASOUND ABDOMEN COMPLETE  COMPARISON:  CT of the abdomen dated 06/26/2007  FINDINGS: Gallbladder: The gallbladder bladder is partially collapsed and therefore suboptimally evaluated, however there is thickening of the gallbladder  wall to 9 mm, which is found to be abnormal even for its decompressed state. There are areas of echogenicity, with associated dense posterior acoustic shadowing likely representing gallstones, the largest of which measures 8 mm.  Common bile duct: Diameter: 8 mm, enlarged.  Liver: No focal lesion identified. Within normal limits in parenchymal echogenicity.  IVC: No abnormality visualized.  Pancreas: Visualized portion unremarkable.  Spleen: Size and appearance within normal limits.  Right Kidney: Length: 11.1 cm. Echogenicity within normal limits. No mass or hydronephrosis visualized.  Left Kidney: Length: 11.5 cm. Echogenicity within normal limits. No mass or hydronephrosis visualized.  Abdominal aorta: No aneurysm visualized. Abdominal aortic ectasia is noted. Maximum diameter of 2.1 cm is recorded.  Other findings: None.  IMPRESSION: Cholelithiasis with grossly abnormal appearance of the gallbladder wall, concerning for acute cholecystitis.  Borderline enlargement of the common bile duct.   Electronically Signed   By: Ted Mcalpine M.D.   On: 04/27/2015 17:08    Medications / Allergies: per chart  Antibiotics: Anti-infectives    Start     Dose/Rate Route Frequency Ordered Stop   04/28/15 2000  cefTRIAXone (ROCEPHIN) 2 g in dextrose 5 % 50 mL IVPB  Status:  Discontinued    Comments:  Pharmacy may adjust dosing strength / duration / interval for maximal efficacy   2 g 100 mL/hr over 30 Minutes Intravenous Every 24 hours 04/27/15 2038 04/28/15 1005   04/28/15 2000  cefTRIAXone (ROCEPHIN) 2 g in dextrose 5 % 50 mL IVPB    Comments:  Pharmacy may adjust dosing strength / duration / interval for maximal efficacy   2 g 100 mL/hr over 30 Minutes Intravenous Every 24 hours 04/28/15 1005 04/30/15 1959   04/28/15 1600  metroNIDAZOLE (FLAGYL) IVPB 500 mg     500 mg 100 mL/hr over 60 Minutes Intravenous Every 6 hours 04/28/15 1005 04/29/15 2159   04/27/15 2115  cefTRIAXone (ROCEPHIN) 1 g in dextrose  5 % 50 mL IVPB     1 g 100 mL/hr over 30 Minutes Intravenous STAT 04/27/15 2103 04/27/15 2305   04/27/15 2100  metroNIDAZOLE (FLAGYL) IVPB 500 mg  Status:  Discontinued     500 mg 100 mL/hr over 60 Minutes Intravenous Every 6 hours 04/27/15 2038 04/28/15 1005   04/27/15 1830  cefTRIAXone (ROCEPHIN) 1 g in dextrose 5 % 50 mL IVPB     1 g 100 mL/hr over 30 Minutes Intravenous  Once 04/27/15 1825 04/27/15 1913        Note: Portions of this report may have been transcribed using voice recognition software. Every effort was made to ensure accuracy; however, inadvertent computerized transcription errors may be present.   Any transcriptional errors that result from this process are unintentional.   Ardeth Sportsman, M.D., F.A.C.S. Gastrointestinal and Minimally Invasive [facility] [address], [address] [place], [place] [address] [phone] Main / Paging   04/29/2015  CARE TEAM:  PCP: Kaleen Mask, MD  Outpatient Care Team: Patient Care Team: Kaleen Mask, MD as PCP - General (Family Medicine) Karie Soda, MD as Consulting Physician (General Surgery)  Inpatient Treatment Team: Treatment Team: Attending Runell Kovich: Bishop Limbo, MD; Consulting Physician: Md Montez Morita, MD; Registered Nurse: Dina Rich, RN; Consulting Physician: Willis Modena, MD; Technician: Bing Neighbors, NT

## 2015-04-29 NOTE — Progress Notes (Signed)
UNASSIGNED PATIENT  Subjective: Since I last evaluated the patient, he seems to be feeling better; has some abdominal soreness. No nausea or vomiting.  Objective: Vital signs in last 24 hours: Temp:  [97.8 F (36.6 C)-98.4 F (36.9 C)] 98.1 F (36.7 C) (09/25 0604) Pulse Rate:  [53-66] 62 (09/25 0604) Resp:  [14-18] 16 (09/25 0604) BP: (112-148)/(58-77) 120/58 mmHg (09/25 0604) SpO2:  [95 %-100 %] 99 % (09/25 0604) Last BM Date: 04/27/15  Intake/Output from previous day: 09/24 0701 - 09/25 0700 In: 4240 [P.O.:1340; I.V.:2900] Out: 3700 [Urine:3600; Blood:100] Intake/Output this shift: Total I/O In: -  Out: 800 [Urine:800]  General appearance: alert, cooperative, appears stated age and no distress Resp: clear to auscultation bilaterally Cardio: regular rate and rhythm, S1, S2 normal, no murmur, click, rub or gallop GI: soft, mild tenderness on palpation with hypoactive BS normal; no masses,  no organomegaly Extremities: extremities normal, atraumatic, no cyanosis or edema  Lab Results:  Recent Labs  04/27/15 1441 04/28/15 0434 04/29/15 0515  WBC 8.2 7.8 12.1*  HGB 15.7 14.5 13.4  HCT 43.7 41.2 38.5*  PLT 298 277 275   BMET  Recent Labs  04/27/15 1441 04/29/15 0515  NA 132* 135  K 3.7 4.5  CL 99* 99*  CO2 27 27  GLUCOSE 129* 133*  BUN <5* 7  CREATININE 0.80 0.76  CALCIUM 9.2 9.2   LFT  Recent Labs  04/28/15 0434 04/29/15 0515  PROT 6.7 6.8  ALBUMIN 3.7 4.0  AST 204* 302*  ALT 295* 438*  ALKPHOS 178* 195*  BILITOT 2.6* 2.9*  BILIDIR 2.0*  --   IBILI 0.6  --    Studies/Results: Dg Chest 2 View  04/27/2015   CLINICAL DATA:  Abdominal pain.  Smoker.  EXAM: CHEST  2 VIEW  COMPARISON:  06/26/2007.  FINDINGS: Normal sized heart. Clear lungs. The lungs are mildly hyperexpanded with mild diffuse peribronchial thickening. Mild thoracic spine degenerative changes and mild scoliosis.  IMPRESSION: No acute abnormality.  Mild changes of COPD and chronic  bronchitis.   Electronically Signed   By: Beckie Salts M.D.   On: 04/27/2015 18:56   Dg Cholangiogram Operative  04/28/2015   CLINICAL DATA:  54 year old male with a history of acute cholecystitis and intraoperative cholangiogram  EXAM: INTRAOPERATIVE CHOLANGIOGRAM  TECHNIQUE: Cholangiographic images from the C-arm fluoroscopic device were submitted for interpretation post-operatively. Please see the procedural report for the amount of contrast and the fluoroscopy time utilized.  COMPARISON:  Ultrasound 04/27/2015  FINDINGS: Surgical instruments project over the upper abdomen.  There is cannulation of the cystic duct/gallbladder neck, with antegrade infusion of contrast. Caliber of the extrahepatic ductal system within normal limits.  Vague filling defects at the distal common bile duct may represent gas, debris, or stones.  Contrast does not cross the ampulla into the duodenum.  IMPRESSION: Intraoperative cholangiogram demonstrates extrahepatic biliary ducts of unremarkable caliber, with vague filling defects at the distal common bile duct either representing debris, air, or stones. Contrast does not cross the ampulla into the duodenum on the final images.  Please refer to the dictated operative report for full details of intraoperative findings and procedure.  Signed,  Yvone Neu. Loreta Ave, DO  [facility]  [facility]   Electronically Signed   By: Gilmer Mor D.O.   On: 04/28/2015 08:43   US Abdomen Complete  04/27/2015   CLINICAL DATA:  Right upper quadrant for 2 days. Increased liver function tests.  EXAM: ULTRASOUND ABDOMEN  COMPLETE  COMPARISON:  CT of the abdomen dated 06/26/2007  FINDINGS: Gallbladder: The gallbladder bladder is partially collapsed and therefore suboptimally evaluated, however there is thickening of the gallbladder wall to 9 mm, which is found to be abnormal even for its decompressed state. There are areas of echogenicity, with associated  dense posterior acoustic shadowing likely representing gallstones, the largest of which measures 8 mm.  Common bile duct: Diameter: 8 mm, enlarged.  Liver: No focal lesion identified. Within normal limits in parenchymal echogenicity.  IVC: No abnormality visualized.  Pancreas: Visualized portion unremarkable.  Spleen: Size and appearance within normal limits.  Right Kidney: Length: 11.1 cm. Echogenicity within normal limits. No mass or hydronephrosis visualized.  Left Kidney: Length: 11.5 cm. Echogenicity within normal limits. No mass or hydronephrosis visualized.  Abdominal aorta: No aneurysm visualized. Abdominal aortic ectasia is noted. Maximum diameter of 2.1 cm is recorded.  Other findings: None.  IMPRESSION: Cholelithiasis with grossly abnormal appearance of the gallbladder wall, concerning for acute cholecystitis.  Borderline enlargement of the common bile duct.   Electronically Signed   By: Ted Mcalpine M.D.   On: 04/27/2015 17:08   Medications: I have reviewed the patient's current medications.  Assessment/Plan: 1) Abnormal LFT's s/p lap cholecystectomy for calculous cholecystitis with a positive IOC ?choledocholithiasis: ERCP scheduled for tomorrow. On Rocephin.  LOS: 2 days   MANN,JYOTHI 04/29/2015, 9:33 AM

## 2015-04-30 ENCOUNTER — Inpatient Hospital Stay (HOSPITAL_COMMUNITY): Payer: Managed Care, Other (non HMO) | Admitting: Anesthesiology

## 2015-04-30 ENCOUNTER — Inpatient Hospital Stay (HOSPITAL_COMMUNITY): Payer: Managed Care, Other (non HMO)

## 2015-04-30 ENCOUNTER — Encounter (HOSPITAL_COMMUNITY): Admission: EM | Disposition: A | Discharge status: Home / Self Care | Payer: Self-pay | Source: Home / Self Care

## 2015-04-30 ENCOUNTER — Encounter (HOSPITAL_COMMUNITY): Payer: Self-pay | Admitting: Surgery

## 2015-04-30 HISTORY — PX: ERCP: SHX5425

## 2015-04-30 LAB — CBC
HEMATOCRIT: 37.4 % — AB (ref 39.0–52.0)
HEMOGLOBIN: 13 g/dL (ref 13.0–17.0)
MCH: 33.2 pg (ref 26.0–34.0)
MCHC: 34.8 g/dL (ref 30.0–36.0)
MCV: 95.4 fL (ref 78.0–100.0)
Platelets: 268 10*3/uL (ref 150–400)
RBC: 3.92 MIL/uL — ABNORMAL LOW (ref 4.22–5.81)
RDW: 14.9 % (ref 11.5–15.5)
WBC: 7.1 10*3/uL (ref 4.0–10.5)

## 2015-04-30 LAB — HEPATIC FUNCTION PANEL
ALBUMIN: 3.7 g/dL (ref 3.5–5.0)
ALK PHOS: 174 U/L — AB (ref 38–126)
ALT: 337 U/L — AB (ref 17–63)
AST: 139 U/L — AB (ref 15–41)
BILIRUBIN TOTAL: 1.7 mg/dL — AB (ref 0.3–1.2)
Bilirubin, Direct: 0.9 mg/dL — ABNORMAL HIGH (ref 0.1–0.5)
Indirect Bilirubin: 0.8 mg/dL (ref 0.3–0.9)
Total Protein: 6.3 g/dL — ABNORMAL LOW (ref 6.5–8.1)

## 2015-04-30 LAB — POTASSIUM: Potassium: 3.9 mmol/L (ref 3.5–5.1)

## 2015-04-30 SURGERY — ERCP, WITH INTERVENTION IF INDICATED
Anesthesia: Monitor Anesthesia Care | Laterality: Left

## 2015-04-30 MED ORDER — SODIUM CHLORIDE 0.9 % IV SOLN
INTRAVENOUS | Status: DC | PRN
  Administered 2015-04-30: 25 mL

## 2015-04-30 MED ORDER — ONDANSETRON HCL 4 MG/2ML IJ SOLN
INTRAMUSCULAR | Status: AC
  Filled 2015-04-30: qty 2

## 2015-04-30 MED ORDER — LIDOCAINE HCL (CARDIAC) 20 MG/ML IV SOLN
INTRAVENOUS | Status: DC | PRN
  Administered 2015-04-30: 50 mg via INTRAVENOUS

## 2015-04-30 MED ORDER — GLYCOPYRROLATE 0.2 MG/ML IJ SOLN
INTRAMUSCULAR | Status: DC | PRN
  Administered 2015-04-30: 0.2 mg via INTRAVENOUS

## 2015-04-30 MED ORDER — PROPOFOL 10 MG/ML IV BOLUS
INTRAVENOUS | Status: AC
  Filled 2015-04-30: qty 20

## 2015-04-30 MED ORDER — CIPROFLOXACIN IN D5W 400 MG/200ML IV SOLN
INTRAVENOUS | Status: AC
  Filled 2015-04-30: qty 200

## 2015-04-30 MED ORDER — FENTANYL CITRATE (PF) 100 MCG/2ML IJ SOLN
INTRAMUSCULAR | Status: AC
  Filled 2015-04-30: qty 4

## 2015-04-30 MED ORDER — FENTANYL CITRATE (PF) 100 MCG/2ML IJ SOLN
INTRAMUSCULAR | Status: DC | PRN
  Administered 2015-04-30 (×2): 50 ug via INTRAVENOUS

## 2015-04-30 MED ORDER — PROPOFOL 500 MG/50ML IV EMUL
INTRAVENOUS | Status: DC | PRN
  Administered 2015-04-30: 50 mg via INTRAVENOUS
  Administered 2015-04-30: 150 mg via INTRAVENOUS

## 2015-04-30 MED ORDER — DEXAMETHASONE SODIUM PHOSPHATE 10 MG/ML IJ SOLN
INTRAMUSCULAR | Status: AC
  Filled 2015-04-30: qty 1

## 2015-04-30 MED ORDER — SUCCINYLCHOLINE CHLORIDE 20 MG/ML IJ SOLN
INTRAMUSCULAR | Status: DC | PRN
  Administered 2015-04-30: 100 mg via INTRAVENOUS

## 2015-04-30 MED ORDER — CIPROFLOXACIN IN D5W 400 MG/200ML IV SOLN
INTRAVENOUS | Status: DC | PRN
  Administered 2015-04-30: 400 mg via INTRAVENOUS

## 2015-04-30 MED ORDER — LIDOCAINE HCL (CARDIAC) 20 MG/ML IV SOLN
INTRAVENOUS | Status: AC
  Filled 2015-04-30: qty 5

## 2015-04-30 MED ORDER — LACTATED RINGERS IV SOLN
INTRAVENOUS | Status: DC
Start: 2015-04-30 — End: 2015-04-30
  Administered 2015-04-30: 1000 mL via INTRAVENOUS

## 2015-04-30 NOTE — Anesthesia Postprocedure Evaluation (Signed)
  Anesthesia Post-op Note  Patient: Manuel Anderson  Procedure(s) Performed: Procedure(s) (LRB): ENDOSCOPIC RETROGRADE CHOLANGIOPANCREATOGRAPHY (ERCP) (Left)  Patient Location: PACU  Anesthesia Type: General  Level of Consciousness: awake and alert   Airway and Oxygen Therapy: Patient Spontanous Breathing  Post-op Pain: mild  Post-op Assessment: Post-op Vital signs reviewed, Patient's Cardiovascular Status Stable, Respiratory Function Stable, Patent Airway and No signs of Nausea or vomiting  Last Vitals:  Filed Vitals:   04/30/15 1330  BP: 140/89  Pulse: 97  Temp:   Resp: 12    Post-op Vital Signs: stable   Complications: No apparent anesthesia complications

## 2015-04-30 NOTE — Progress Notes (Signed)
2 Days Post-Op  Subjective: He is hungry and want to eat.  Waiting or ERCP.     Objective: Vital signs in last 24 hours: Temp:  [97.5 F (36.4 C)-98.7 F (37.1 C)] 98.7 F (37.1 C) (09/26 0602) Pulse Rate:  [57-61] 57 (09/26 0602) Resp:  [16-18] 18 (09/26 0602) BP: (115-127)/(49-70) 127/68 mmHg (09/26 0602) SpO2:  [97 %-99 %] 97 % (09/26 0602) Last BM Date: 04/29/15 1040 PO yesterday Stools x 2  Recorded Afebrile  VSS LFT's still up but improving IOC:  Intraoperative cholangiogram demonstrates extrahepatic biliary ducts of unremarkable caliber, with vague filling defects at the distal common bile duct either representing debris, air, or stones. Contrast does not cross the ampulla into the duodenum on the final images. Intake/Output from previous day: 09/25 0701 - 09/26 0700 In: 2340.8 [P.O.:1040; I.V.:1300.8] Out: 5150 [Urine:5150] Intake/Output this shift:    General appearance: alert, cooperative and no distress Resp: clear to auscultation bilaterally GI: soft, non-tender; bowel sounds normal; no masses,  no organomegaly  Lab Results:   Recent Labs  04/29/15 0515 04/30/15 0425  WBC 12.1* 7.1  HGB 13.4 13.0  HCT 38.5* 37.4*  PLT 275 268    BMET  Recent Labs  04/27/15 1441 04/29/15 0515 04/30/15 0425  NA 132* 135  --   K 3.7 4.5 3.9  CL 99* 99*  --   CO2 27 27  --   GLUCOSE 129* 133*  --   BUN <5* 7  --   CREATININE 0.80 0.76  --   CALCIUM 9.2 9.2  --    PT/INR No results for input(s): LABPROT, INR in the last 72 hours.   Recent Labs Lab 04/27/15 1441 04/28/15 0434 04/29/15 0515 04/30/15 0425  AST 218* 204* 302* 139*  ALT 287* 295* 438* 337*  ALKPHOS 148* 178* 195* 174*  BILITOT 1.4* 2.6* 2.9* 1.7*  PROT 7.4 6.7 6.8 6.3*  ALBUMIN 4.4 3.7 4.0 3.7     Lipase     Component Value Date/Time   LIPASE 25 04/29/2015 0515     Studies/Results: No results found.  Medications: . enoxaparin (LOVENOX) injection  40 mg Subcutaneous Q2000  .  lip balm  1 application Topical BID  . multivitamins with iron  1 tablet Oral Daily  . naproxen  500 mg Oral BID WC  . nicotine  14 mg Transdermal Daily  . pantoprazole  40 mg Oral Daily  . polyethylene glycol  17 g Oral BID  . saccharomyces boulardii  250 mg Oral BID  . sodium chloride  3 mL Intravenous Q12H  . vitamin C  500 mg Oral BID   . dextrose 5 % and 0.9% NaCl 50 mL/hr at 04/29/15 2359   Prior to Admission medications   Medication Sig Start Date End Date Taking? Authorizing Mirka Barbone  omeprazole (PRILOSEC) 20 MG capsule Take 20 mg by mouth daily.   Yes Historical Ferrah Panagopoulos, MD  Simethicone (GAS-X PO) Take 1 tablet by mouth 2 (two) times daily as needed (gas).   Yes Historical Tyrae Alcoser, MD  triamcinolone cream (KENALOG) 0.5 % Apply 1 application topically every 6 (six) hours as needed (rash).   Yes Historical Jarian Longoria, MD  HYDROcodone-acetaminophen (NORCO) 7.5-325 MG per tablet Take 1-2 tablets by mouth every 4 (four) hours as needed for moderate pain. 04/28/15   Karie Soda, MD     Assessment/Plan Acute cholecystitis S/p LAPAROSCOPIC CHOLECYSTECTOMY WITH INTRAOPERATIVE CHOLANGIOGRAM, Wedge liver biopsy, 04/28/15, Dr. Star Age Tobacco use Antibiotics:  Completed 3 days  of Rocephin/Flagyl yesterday DVT:  Lovenox/ SCD's added   Plan:  ERCP today, check labs and aim for discharge tomorrow.     LOS: 3 days    JENNINGS,WILLARD 04/30/2015

## 2015-04-30 NOTE — Anesthesia Procedure Notes (Signed)
Procedure Name: Intubation Date/Time: 04/30/2015 12:27 PM Performed by: Thornell Mule Pre-anesthesia Checklist: Patient identified, Emergency Drugs available, Suction available and Patient being monitored Patient Re-evaluated:Patient Re-evaluated prior to inductionOxygen Delivery Method: Circle System Utilized Preoxygenation: Pre-oxygenation with 100% oxygen Intubation Type: IV induction Ventilation: Mask ventilation without difficulty Laryngoscope Size: Miller and 3 Grade View: Grade I Tube type: Oral Tube size: 7.5 mm Number of attempts: 1 Airway Equipment and Method: Stylet and Oral airway Placement Confirmation: ETT inserted through vocal cords under direct vision,  positive ETCO2 and breath sounds checked- equal and bilateral Secured at: 21 cm Tube secured with: Tape Dental Injury: Teeth and Oropharynx as per pre-operative assessment

## 2015-04-30 NOTE — Anesthesia Preprocedure Evaluation (Addendum)
Anesthesia Evaluation  Patient identified by MRN, date of birth, ID band Patient awake    Reviewed: Allergy & Precautions, NPO status , Patient's Chart, lab work & pertinent test results  Airway Mallampati: II  TM Distance: >3 FB Neck ROM: Full    Dental no notable dental hx.    Pulmonary Current Smoker,    Pulmonary exam normal breath sounds clear to auscultation       Cardiovascular negative cardio ROS Normal cardiovascular exam Rhythm:Regular Rate:Normal     Neuro/Psych negative neurological ROS  negative psych ROS   GI/Hepatic Neg liver ROS, GERD  ,  Endo/Other  negative endocrine ROS  Renal/GU negative Renal ROS  negative genitourinary   Musculoskeletal negative musculoskeletal ROS (+)   Abdominal   Peds negative pediatric ROS (+)  Hematology negative hematology ROS (+)   Anesthesia Other Findings   Reproductive/Obstetrics negative OB ROS                          Anesthesia Physical Anesthesia Plan  ASA: II  Anesthesia Plan: MAC   Post-op Pain Management:    Induction: Intravenous  Airway Management Planned: Simple Face Mask  Additional Equipment:   Intra-op Plan:   Post-operative Plan: Extubation in OR  Informed Consent: I have reviewed the patients History and Physical, chart, labs and discussed the procedure including the risks, benefits and alternatives for the proposed anesthesia with the patient or authorized representative who has indicated his/her understanding and acceptance.   Dental advisory given  Plan Discussed with: CRNA and Surgeon  Anesthesia Plan Comments: (Will convert to GA if necessary)       Anesthesia Quick Evaluation

## 2015-04-30 NOTE — Op Note (Signed)
[facility] [address] [place], [address]   ERCP PROCEDURE REPORT        EXAM DATE: 05/09/2015  PATIENT NAME:          Manuel Anderson, Manuel Anderson          MR #: [id number] BIRTHDATE:       29-Nov-1960     VISIT #:     [phone] ATTENDING:     Jeani Hawking, MD     STATUS:     inpatient ASSISTANT:  INDICATIONS:  The patient is a 54 yr old male here for an ERCP due to choledocholithiasis. PROCEDURE PERFORMED:     EUS and ERCP MEDICATIONS:     General Anesthesia  CONSENT: The patient understands the risks and benefits of the procedure and understands that these risks include, but are not limited to: sedation, allergic reaction, infection, perforation and/or bleeding. Alternative means of evaluation and treatment include, among others: physical exam, x-rays, and/or surgical intervention. The patient elects to proceed with this endoscopic procedure.  DESCRIPTION OF PROCEDURE: During intra-op preparation period all mechanical & medical equipment was checked for proper function. Hand hygiene and appropriate measures for infection prevention was taken. After the risks, benefits and alternatives of the procedure were thoroughly explained, Informed was verified, confirmed and timeout was successfully executed by the treatment team. With the patient in left semi-prone position, medications were administered intravenously.The    was passed from the mouth into the esophagus and further advanced from the esophagus into the stomach. From stomach scope was directed to the second portion of the duodenum. Major papilla was aligned with the duodenoscope. The scope position was confirmed fluoroscopically. Rest of the findings/therapeutics are given below. The scope was then completely withdrawn from the patient and the procedure completed. The pulse, BP, and O2 saturation were monitored and documented by the physician and the nursing staff throughout the entire procedure. The  patient was cared for as planned according to standard protocol. The patient was then discharged to recovery in stable condition and with appropriate post procedure care. Estimated blood loss is zero unless otherwise noted in this procedure report.  Passage of the EUS scope enountered mild/moderate resistance at the GE junction.  A glimpse of a distal esophageal stricture was encountered.  Passage of the scope dilated the area.  The EUS revealed a small distal CBD stone and then the procedure was converted to an ERCP.  The ampulla was located the second portion of the duodenum and the CBD was easily cannulated.  The guidewire was secured in the left intrahepatic ducts.  Contrast injection revealed a dilation of the CBD at 10 mm.  A 12 mm sphincterotomy was created and the duct was swept a total of 5 times.  After the first pass the small stone was extracted.  During the last sweep an occlusion cholangiogram was performed and there was no evidence of any retained stones or bile leaks.  Cannulation of the PD did not occur.    ADVERSE EVENT:     No immediate. IMPRESSIONS:     1) Choledocholithiasis s/p successful stone extraction with the ERCP. 2) Distal benign esophageal stricture dilated with passage of the endoscope.  RECOMMENDATIONS:     1) Routine post-operative care. REPEAT EXAM:  ___________________________________ Jeani Hawking, MD eSigned:  Jeani Hawking, MD 05/09/2015 1:25 PM  cc:  CPT CODES: ICD9 CODES:

## 2015-04-30 NOTE — Transfer of Care (Signed)
Immediate Anesthesia Transfer of Care Note  Patient: Manuel Anderson  Procedure(s) Performed: Procedure(s): ENDOSCOPIC RETROGRADE CHOLANGIOPANCREATOGRAPHY (ERCP) (Left)  Patient Location: PACU  Anesthesia Type:MAC  Level of Consciousness: awake, alert  and oriented  Airway & Oxygen Therapy: Patient Spontanous Breathing and Patient connected to face mask oxygen  Post-op Assessment: Report given to RN and Post -op Vital signs reviewed and stable  Post vital signs: Reviewed and stable  Last Vitals:  Filed Vitals:   04/30/15 1138  BP: 155/91  Pulse: 57  Temp: 36.7 C  Resp: 12    Complications: No apparent anesthesia complications

## 2015-05-01 ENCOUNTER — Encounter: Payer: Self-pay | Admitting: General Surgery

## 2015-05-01 ENCOUNTER — Encounter (HOSPITAL_COMMUNITY): Payer: Self-pay | Admitting: Gastroenterology

## 2015-05-01 LAB — LIPASE, BLOOD: Lipase: 23 U/L (ref 22–51)

## 2015-05-01 LAB — HEPATIC FUNCTION PANEL
ALT: 237 U/L — AB (ref 17–63)
AST: 68 U/L — ABNORMAL HIGH (ref 15–41)
Albumin: 3.6 g/dL (ref 3.5–5.0)
Alkaline Phosphatase: 167 U/L — ABNORMAL HIGH (ref 38–126)
BILIRUBIN INDIRECT: 0.5 mg/dL (ref 0.3–0.9)
Bilirubin, Direct: 0.5 mg/dL (ref 0.1–0.5)
TOTAL PROTEIN: 6 g/dL — AB (ref 6.5–8.1)
Total Bilirubin: 1 mg/dL (ref 0.3–1.2)

## 2015-05-01 MED ORDER — HYDROCODONE-ACETAMINOPHEN 7.5-325 MG PO TABS: 1.0000 | ORAL_TABLET | ORAL | Status: AC | PRN

## 2015-05-01 MED ORDER — IBUPROFEN 200 MG PO TABS: ORAL_TABLET | ORAL | Status: AC

## 2015-05-01 NOTE — Discharge Summary (Signed)
Physician Discharge Summary  Patient ID: Manuel Anderson MRN: [id number] DOB/AGE: [birth year] 54 y.o.  Admit date: 04/27/2015 Discharge date: 05/01/2015  Admission Diagnoses:  Acute cholecystitis Tobacco use  Discharge Diagnoses:   Acute cholecystitis Choledocholithiasis  Tobacco use  Chronic pain  Principal Problem:   Acute calculous cholecystitis Active Problems:   Tobacco abuse   Abdominal pain, RUQ (right upper quadrant)   PROCEDURES:  1.  S/p LAPAROSCOPIC CHOLECYSTECTOMY WITH INTRAOPERATIVE CHOLANGIOGRAM, Wedge liver biopsy, 04/28/15, Dr. Star Age 2.  S/p ERCP/EUS Dilatation of benign esophageal stricture 04/30/15. Dr. Otilio Saber Course:  Pleasant smoking male. Comes today with his wife. Tends to avoid doctors. Does moderate intense physical activity at work. Had an episode of upper abdominal pain mainly right-sided about 6 months ago. Thought it was heartburn or reflux. Tums and Rolaids seem to help it. Occasionally takes omeprazole for reflux like symptoms. However yesterday after having a hotdog for lunch, he began to have intense abdominal pain in the right & central upper abdomen. Went down but never went away. Tried just a few bites of eggs & then crackers and sips but continued to worsen through the day. He came to the emergency room. Pain very focused in the upper abdomen. More right-sided. Feels bloated and nauseated. Exam and ultrasound concerning for Murphy sign with thickened gallbladder. Suspicious for cholecystitis. Surgical consultation requested.  Normal rather active. Does smoke about a pack and a half of cigarettes a day. No history of heart attacks or strokes. No severe dyspnea on exertion or chest pain. No history of pneumonias. Not on blood thinners. No personal nor family history of GI/colon cancer, inflammatory bowel disease, irritable bowel syndrome, allergy such as Celiac Sprue, dietary/dairy problems, colitis, ulcers  nor gastritis. No recent sick contacts/gastroenteritis. No travel outside the country. No changes in diet. No dysphagia to solids or liquids. He has never had a colonoscopy. No hematochezia, hematemesis, coffee ground emesis. No evidence of prior gastric/peptic ulceration.  Pt was admitted by Dr. Michaell Cowing, and taken to the OR the following day.  He did well with the surgery.  IOC showed some filling defects.  He was seen by GI, LFT's were still up some and plans were made for him to have ERCP on 04/30/15.  He tolerated the procedure well.  The following AM labs continued to improve, he tolerated breakfast and was dressed to go home by the time I saw him.  His sites all look good and we will let him go home today.  Condition on D/C:  Improved   Disposition:   Home  Discharge Instructions    Call MD for:  extreme fatigue    Complete by:  As directed      Call MD for:  hives    Complete by:  As directed      Call MD for:  persistant nausea and vomiting    Complete by:  As directed      Call MD for:  redness, tenderness, or signs of infection (pain, swelling, redness, odor or green/yellow discharge around incision site)    Complete by:  As directed      Call MD for:  severe uncontrolled pain    Complete by:  As directed      Call MD for:    Complete by:  As directed   Temperature > 101.39F     Diet - low sodium heart healthy    Complete by:  As directed  Discharge instructions    Complete by:  As directed   Please see discharge instruction sheets.  Also refer to handout given an office.  Please call our office if you have any questions or concerns [phone]     Discharge wound care:    Complete by:  As directed   If you have closed incisions, shower and bathe over these incisions with soap and water every day.  Remove all surgical dressings on postoperative day #3.  You do not need to replace dressings over the closed incisions unless you feel more comfortable with a Band-Aid  covering it.   If you have an open wound that requires packing, please see wound care instructions.  In general, remove all dressings, wash wound with soap and water and then replace with saline moistened gauze.  Do the dressing change at least every day.  Please call our office [phone] if you have further questions.     Driving Restrictions    Complete by:  As directed   No driving until off narcotics and can safely swerve away without pain during an emergency     Increase activity slowly    Complete by:  As directed   Walk an hour a day.  Use 20-30 minute walks.  When you can walk 30 minutes without difficulty, it is fine to restart low impact/moderate activities such as biking, jogging, swimming, sexual activity, etc.  Eventually you can increase to unrestricted activity when not feeling pain.  If you feel pain: STOP!Marland Kitchen   Let pain protect you from overdoing it.  Use ice/heat & over-the-counter pain medications to help minimize soreness.  If that is not enough, then use your narcotic pain prescription as needed to remain active.  It is better to take extra pain medications and be more active than to stay bedridden to avoid all pain medications.     Lifting restrictions    Complete by:  As directed   Avoid heavy lifting initially.  Do not push through pain.  You have no specific weight limit - if it hurts to do, DON'T DO IT.   If you feel no pain, you are not injuring anything.  Pain will protect you from injury.  Coughing and sneezing are far more stressful to your incision than any lifting.  Avoid resuming heavy lifting / intense activity until off all narcotic pain medications.  When ready to exercise more, give yourself 2 weeks to gradually get back to full intense exercise/activity.     May shower / Bathe    Complete by:  As directed      May walk up steps    Complete by:  As directed      Sexual Activity Restrictions    Complete by:  As directed   Sexual activity as tolerated.  Do not  push through pain.  Pain will protect you from injury.     Walk with assistance    Complete by:  As directed   Walk over an hour a day.  May use a walker/cane/companion to help with balance and stamina.            Medication List    TAKE these medications        GAS-X PO  Take 1 tablet by mouth 2 (two) times daily as needed (gas).     HYDROcodone-acetaminophen 7.5-325 MG per tablet  Commonly known as:  NORCO  Take 1-2 tablets by mouth every 4 (four) hours as needed for moderate  pain.     HYDROcodone-acetaminophen 7.5-325 MG per tablet  Commonly known as:  NORCO  Take 1-2 tablets by mouth every 4 (four) hours as needed for moderate pain or severe pain.     ibuprofen 200 MG tablet  Commonly known as:  MOTRIN IB  You can safely take 2-3 every 6 hours for pain as needed.     omeprazole 20 MG capsule  Commonly known as:  PRILOSEC  Take 20 mg by mouth daily.     triamcinolone cream 0.5 %  Commonly known as:  KENALOG  Apply 1 application topically every 6 (six) hours as needed (rash).           Follow-up Information    Follow up with [facility] On 05/22/2015.   Specialty:  General Surgery   Why:  [facility], 1:30pm, arrive no later than 1:00pm for paperwork   Contact information:   [address] [place] [address] [phone]       Follow up with Kaleen Mask, MD.   Specialty:  Family Medicine   Why:  Let him know you had surgery and follow up with care as needed.   Contact information:   [address] [place] [address] [phone]       Signed: Sherrie George 05/01/2015, 9:36 AM

## 2015-05-01 NOTE — Progress Notes (Signed)
1 Day Post-Op  Subjective: He looks great and is dressed to go home.    Objective: Vital signs in last 24 hours: Temp:  [97.5 F (36.4 C)-99 F (37.2 C)] 98.6 F (37 C) (09/27 0602) Pulse Rate:  [57-97] 63 (09/27 0602) Resp:  [12-23] 16 (09/27 0602) BP: (117-161)/(65-94) 117/65 mmHg (09/27 0602) SpO2:  [94 %-100 %] 96 % (09/27 0602) Last BM Date: 04/30/15 Diet:  Regular Afebrile, VSS LFT's improved, lipase is normal ERCP:  Small filling defects in distal common bile duct are compatible with stones or sludge. Evidence for sphincterotomy and balloon sweep. Wire was advanced into the intrahepatic ducts. No large filling defects in the common bile duct on the final images. IMPRESSION: Removal of common bile duct stones.  Intake/Output from previous day: 09/26 0701 - 09/27 0700 In: 1800 [I.V.:1800] Out: 6125 [Urine:6125] Intake/Output this shift:    General appearance: alert, cooperative and no distress GI: soft sore, sites all look good  Lab Results:   Recent Labs  04/29/15 0515 04/30/15 0425  WBC 12.1* 7.1  HGB 13.4 13.0  HCT 38.5* 37.4*  PLT 275 268    BMET  Recent Labs  04/29/15 0515 04/30/15 0425  NA 135  --   K 4.5 3.9  CL 99*  --   CO2 27  --   GLUCOSE 133*  --   BUN 7  --   CREATININE 0.76  --   CALCIUM 9.2  --    PT/INR No results for input(s): LABPROT, INR in the last 72 hours.   Recent Labs Lab 04/27/15 1441 04/28/15 0434 04/29/15 0515 04/30/15 0425 05/01/15 0448  AST 218* 204* 302* 139* 68*  ALT 287* 295* 438* 337* 237*  ALKPHOS 148* 178* 195* 174* 167*  BILITOT 1.4* 2.6* 2.9* 1.7* 1.0  PROT 7.4 6.7 6.8 6.3* 6.0*  ALBUMIN 4.4 3.7 4.0 3.7 3.6     Lipase     Component Value Date/Time   LIPASE 23 05/01/2015 0448     Studies/Results: Dg Ercp Biliary & Pancreatic Ducts  04/30/2015   CLINICAL DATA:  CBD stone removal.  EXAM: ERCP  TECHNIQUE: Multiple spot images obtained with the fluoroscopic device and submitted for  interpretation post-procedure.  FLUOROSCOPY TIME:  If the device does not provide the exposure index:  Fluoroscopy Time:  1 minutes and 30 seconds  Number of Acquired Images:  5  COMPARISON:  Intraoperative cholangiogram 04/28/2015  FINDINGS: Small filling defects in distal common bile duct are compatible with stones or sludge. Evidence for sphincterotomy and balloon sweep. Wire was advanced into the intrahepatic ducts. No large filling defects in the common bile duct on the final images.  IMPRESSION: Removal of common bile duct stones.  These images were submitted for radiologic interpretation only. Please see the procedural report for the amount of contrast and the fluoroscopy time utilized.   Electronically Signed   By: Richarda Overlie M.D.   On: 04/30/2015 13:54    Medications: . enoxaparin (LOVENOX) injection  40 mg Subcutaneous Q2000  . lip balm  1 application Topical BID  . multivitamins with iron  1 tablet Oral Daily  . naproxen  500 mg Oral BID WC  . nicotine  14 mg Transdermal Daily  . pantoprazole  40 mg Oral Daily  . polyethylene glycol  17 g Oral BID  . saccharomyces boulardii  250 mg Oral BID  . sodium chloride  3 mL Intravenous Q12H  . vitamin C  500 mg Oral BID  Prior to Admission medications   Medication Sig Start Date End Date Taking? Authorizing Provider  omeprazole (PRILOSEC) 20 MG capsule Take 20 mg by mouth daily.   Yes Historical Provider, MD  Simethicone (GAS-X PO) Take 1 tablet by mouth 2 (two) times daily as needed (gas).   Yes Historical Provider, MD  triamcinolone cream (KENALOG) 0.5 % Apply 1 application topically every 6 (six) hours as needed (rash).   Yes Historical Provider, MD  HYDROcodone-acetaminophen (NORCO) 7.5-325 MG per tablet Take 1-2 tablets by mouth every 4 (four) hours as needed for moderate pain. 04/28/15   Karie Soda, MD     Assessment/Plan Acute cholecystitis S/p LAPAROSCOPIC CHOLECYSTECTOMY WITH INTRAOPERATIVE CHOLANGIOGRAM, Wedge liver biopsy,  04/28/15, Dr. Star Age Choledocholithiasis  S/p ERCP/EUS Dilatation of benign esophageal stricture 04/30/15. Dr. Jeani Hawking Tobacco use  Chronic pain  Plan:  Home today follow up in the Clinic.  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  I told him no lifting over 20 pounds for 4 weeks.     LOS: 4 days    JENNINGS,WILLARD 05/01/2015

## 2015-11-09 IMAGING — RF DG ERCP WO/W SPHINCTEROTOMY
1 series · 5 of 5 positions shown · non-contrast
Comparison: Intraoperative cholangiogram 04/28/2015

CLINICAL DATA: CBD stone removal.

EXAM:
ERCP
TECHNIQUE: Multiple spot images obtained with the fluoroscopic device and
submitted for interpretation post-procedure.
FLUOROSCOPY TIME:  If the device does not provide the exposure
index:
Fluoroscopy Time:  1 minutes and 30 seconds
Number of Acquired Images:  5

[Series 1: run · 5 of 5 slices shown]
[im 1/5]
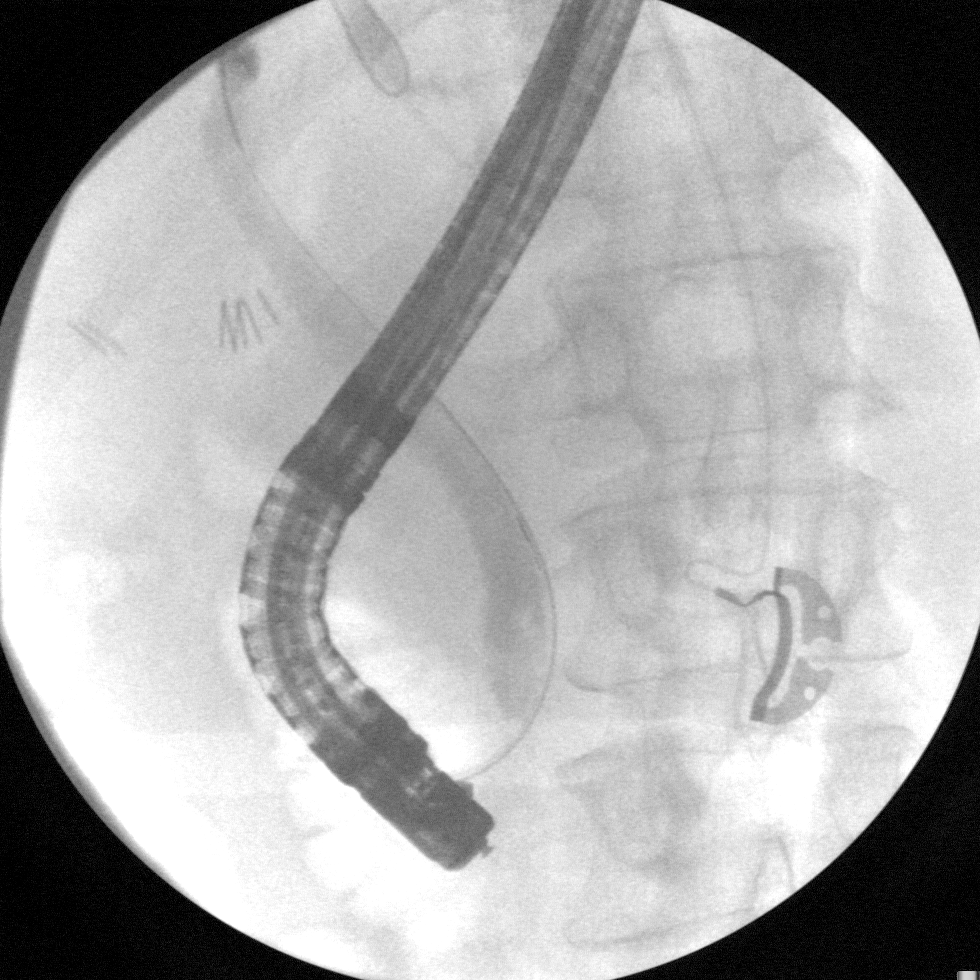
[im 2/5]
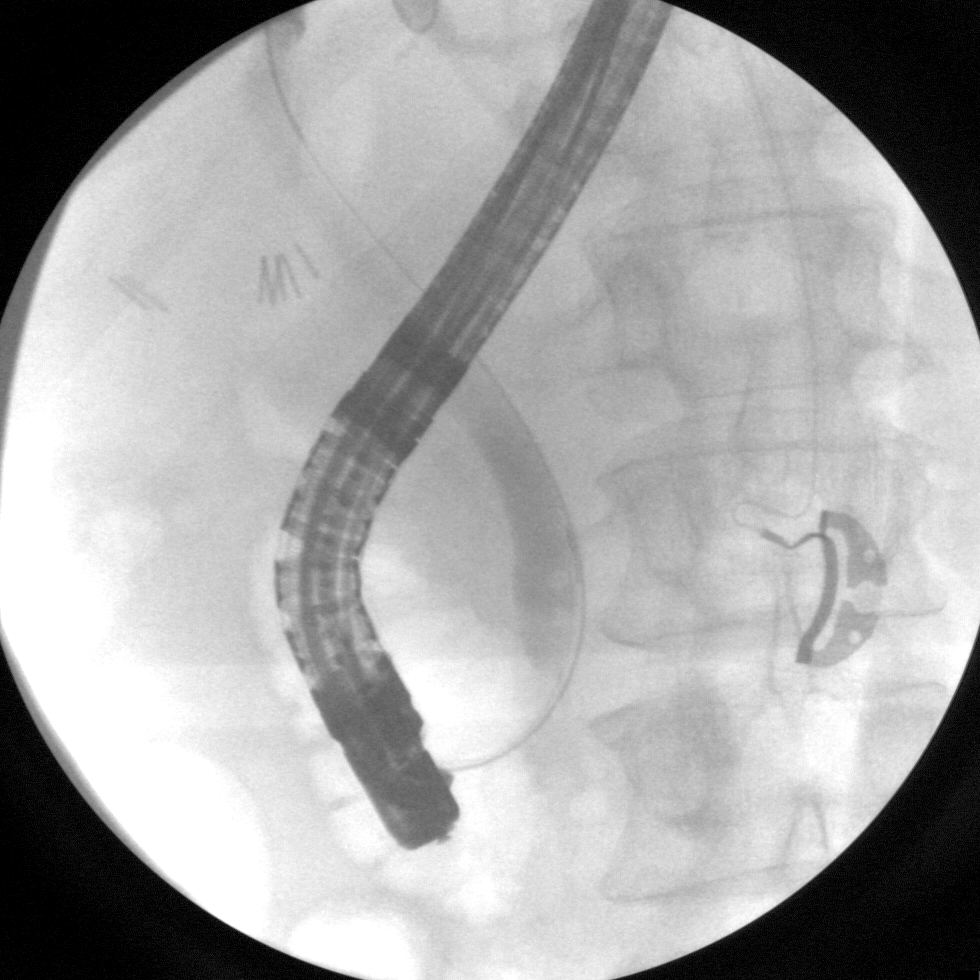
[im 3/5]
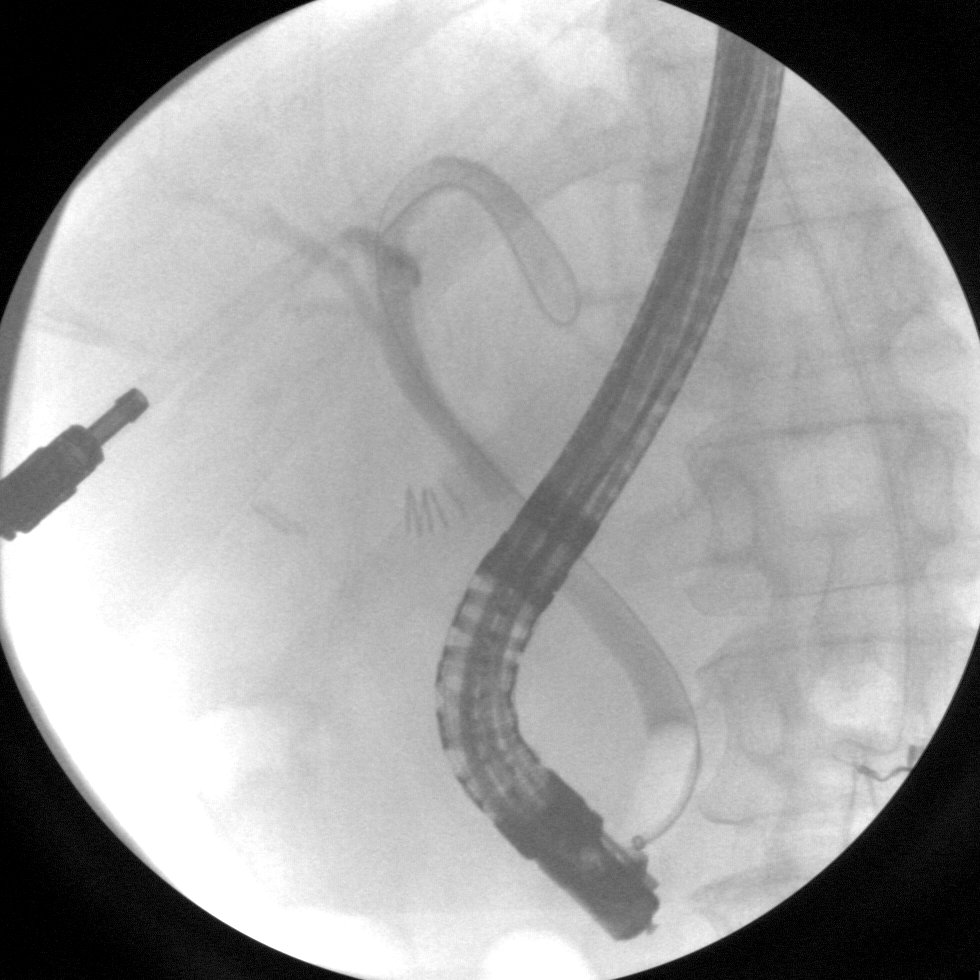
[im 4/5]
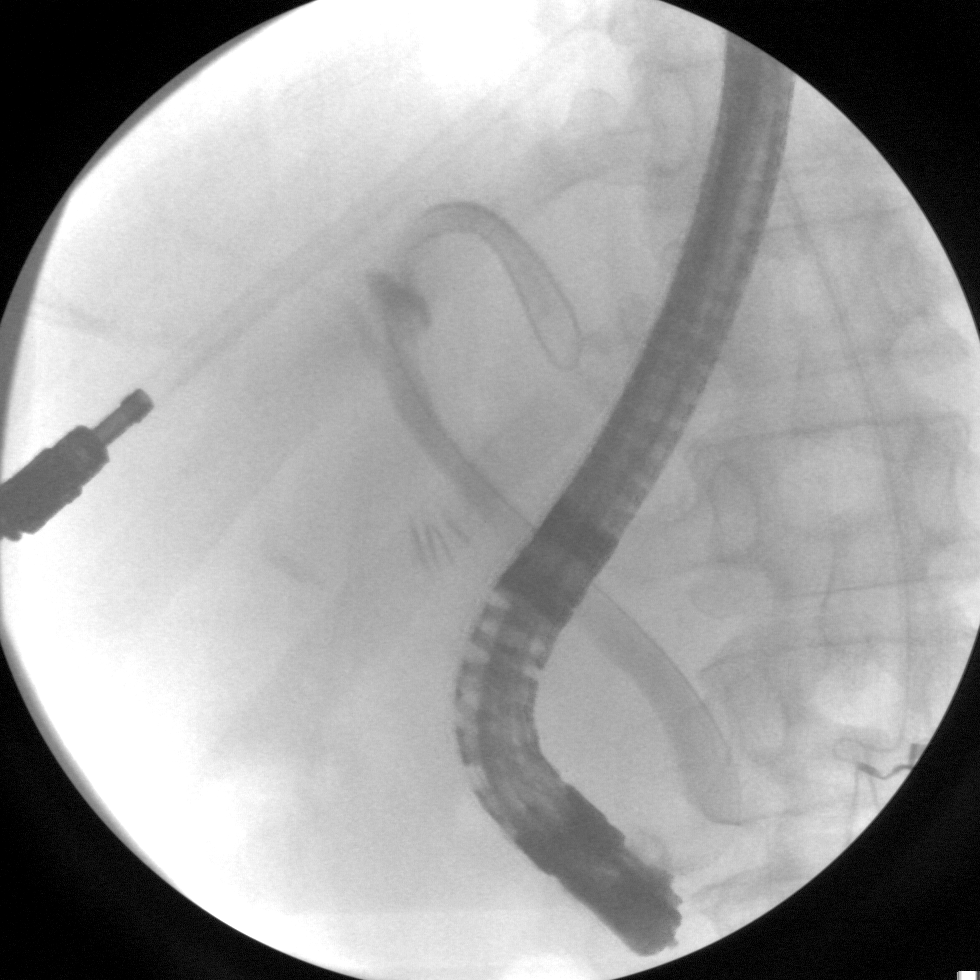
[im 5/5]
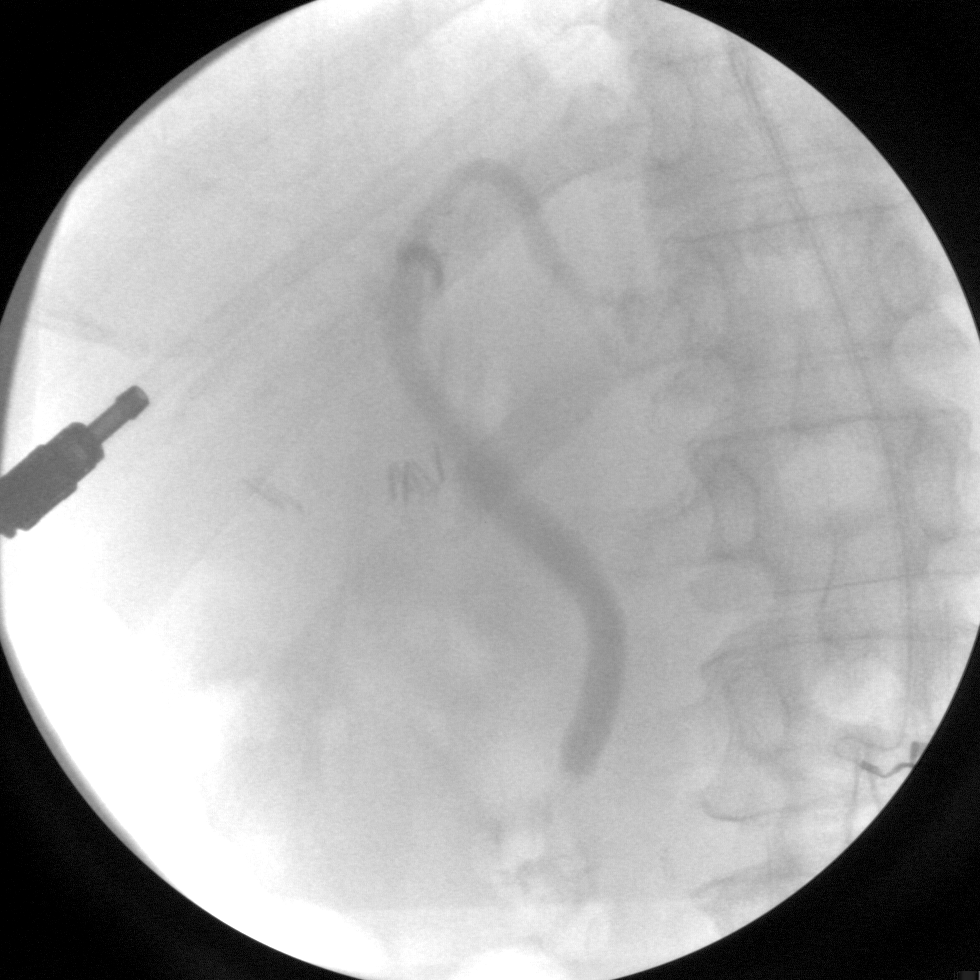

[5 of 5 positions shown; findings below may reference images not displayed]

FINDINGS: Small filling defects in distal common bile duct are compatible with
stones or sludge. Evidence for sphincterotomy and balloon sweep.
Wire was advanced into the intrahepatic ducts. No large filling
defects in the common bile duct on the final images.
IMPRESSION: Removal of common bile duct stones.

These images were submitted for radiologic interpretation only.
Please see the procedural report for the amount of contrast and the
fluoroscopy time utilized.

## 2017-04-07 IMAGING — US US ABDOMEN COMPLETE
1 series · 13 of 25 positions shown · non-contrast
Comparison: CT of the abdomen dated 06/26/2007

CLINICAL DATA: Right upper quadrant for 2 days. Increased liver
function tests.

EXAM:
ULTRASOUND ABDOMEN COMPLETE

[Series 1: us abdomen complete · 0.20mm/px · 13 of 78 slices shown]
[im 1/78]
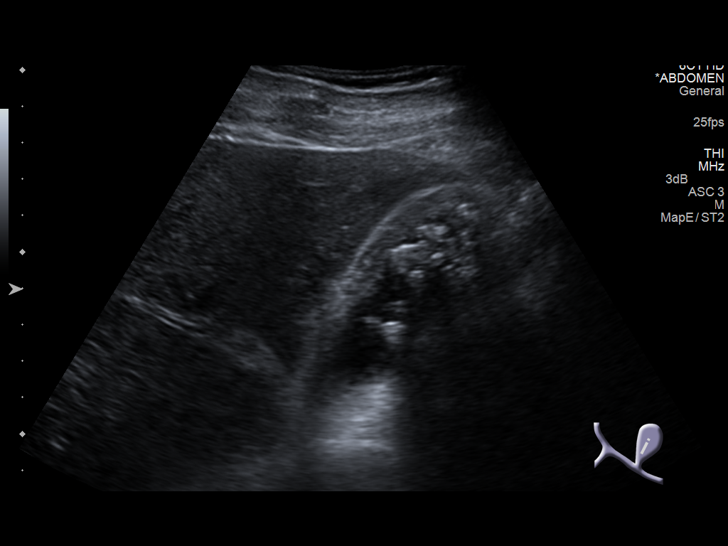
[im 7/78]
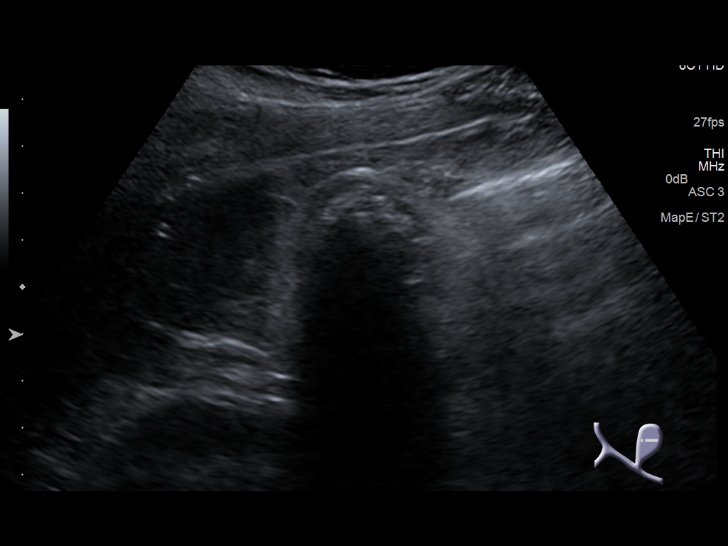
[im 13/78]
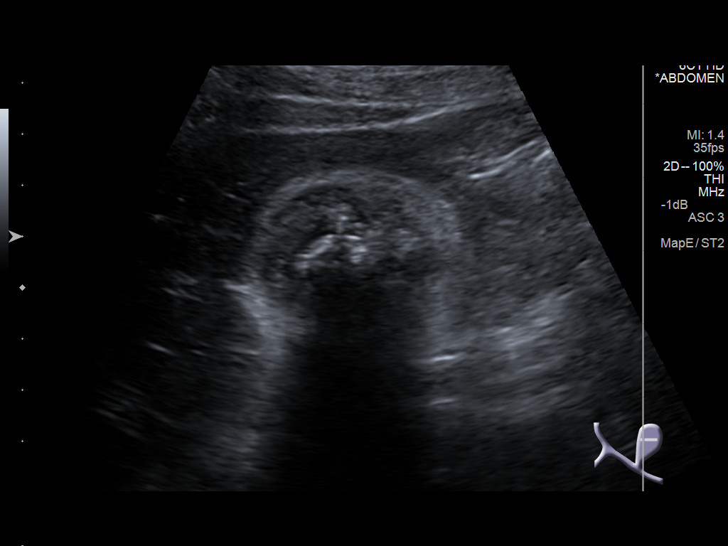
[im 20/78]
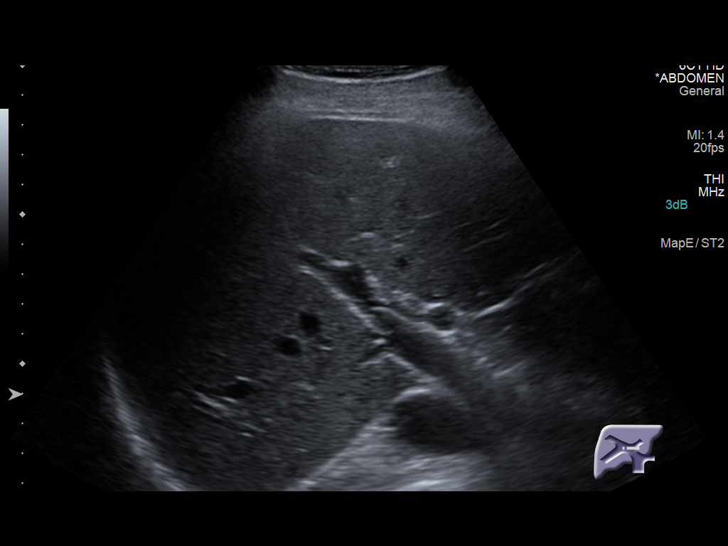
[im 26/78]
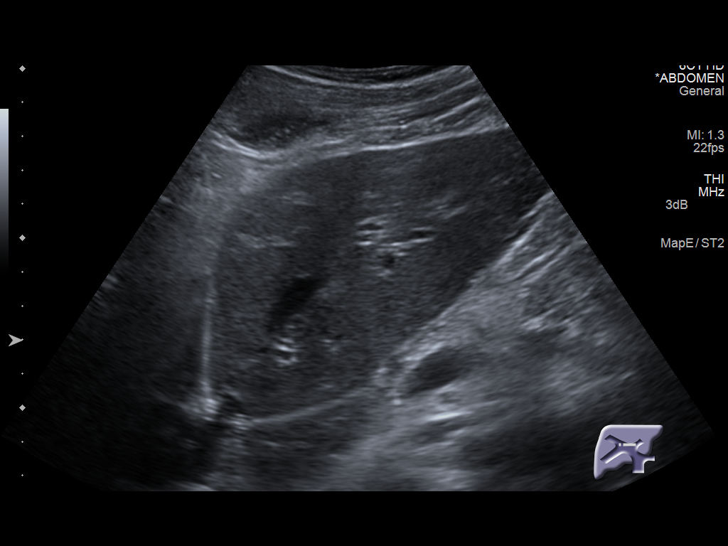
[im 33/78]
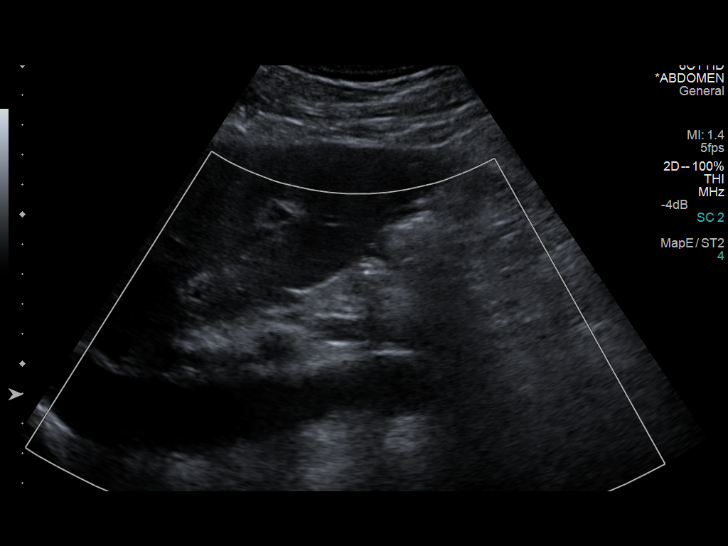
[im 39/78]
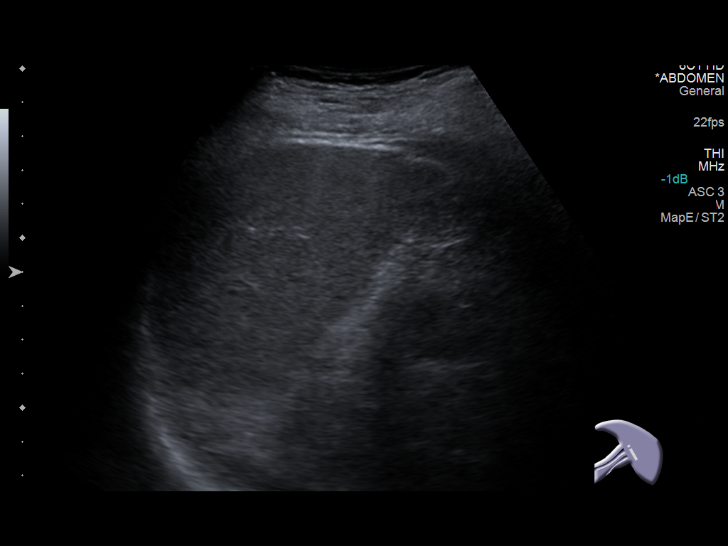
[im 45/78]
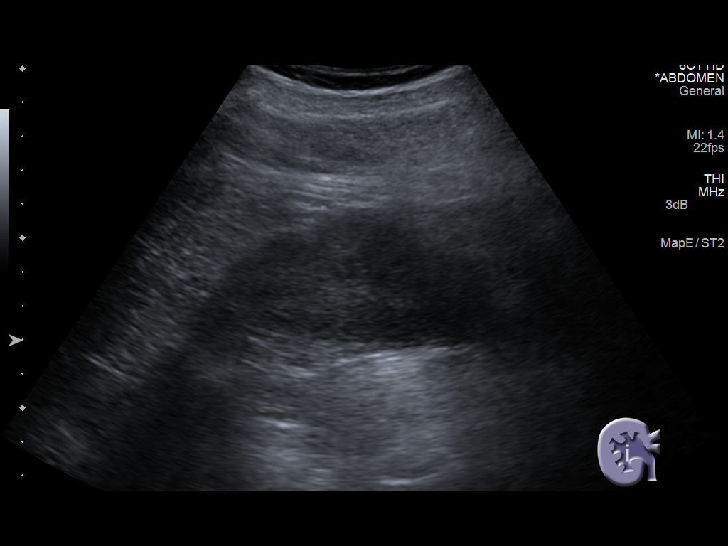
[im 52/78]
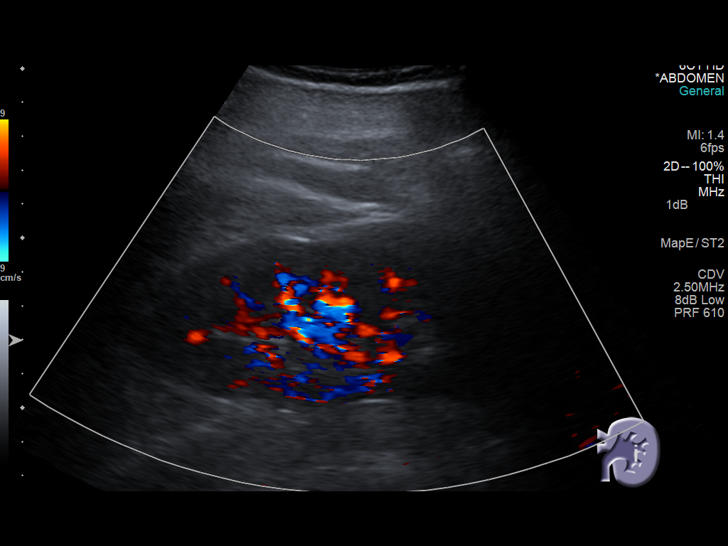
[im 58/78]
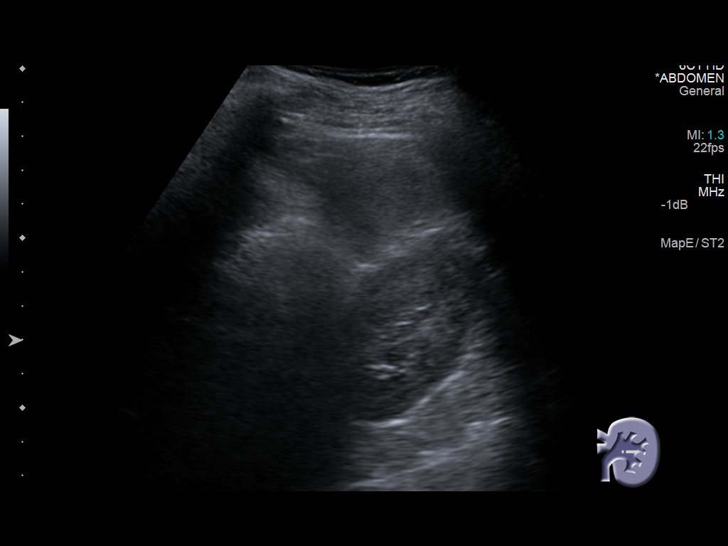
[im 65/78]
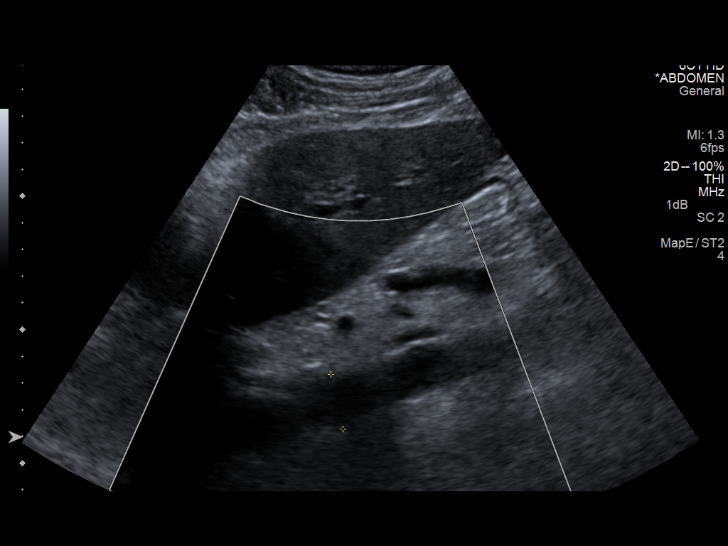
[im 71/78]
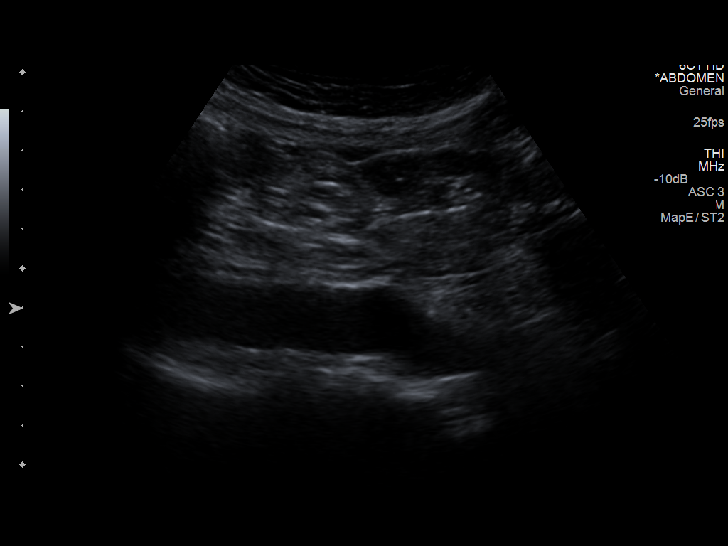
[im 78/78]
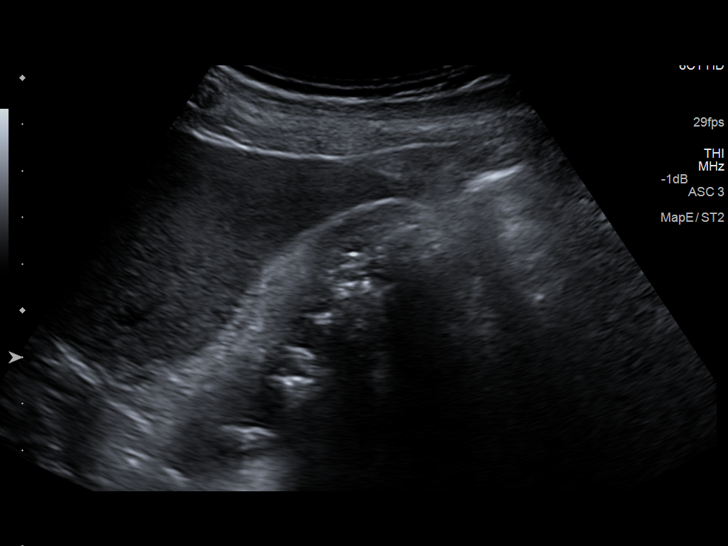

[13 of 25 positions shown; findings below may reference images not displayed]

FINDINGS: Gallbladder: The gallbladder bladder is partially collapsed and
therefore suboptimally evaluated, however there is thickening of the
gallbladder wall to 9 mm, which is found to be abnormal even for its
decompressed state. There are areas of echogenicity, with associated
dense posterior acoustic shadowing likely representing gallstones,
the largest of which measures 8 mm.

Common bile duct: Diameter: 8 mm, enlarged.

Liver: No focal lesion identified. Within normal limits in
parenchymal echogenicity.

IVC: No abnormality visualized.

Pancreas: Visualized portion unremarkable.

Spleen: Size and appearance within normal limits.

Right Kidney: Length: 11.1 cm. Echogenicity within normal limits. No
mass or hydronephrosis visualized.

Left Kidney: Length: 11.5 cm. Echogenicity within normal limits. No
mass or hydronephrosis visualized.

Abdominal aorta: No aneurysm visualized. Abdominal aortic ectasia is
noted. Maximum diameter of 2.1 cm is recorded.

Other findings: None.
IMPRESSION: Cholelithiasis with grossly abnormal appearance of the gallbladder
wall, concerning for acute cholecystitis.

Borderline enlargement of the common bile duct.
